# Patient Record
Sex: Female | Born: 1950 | Race: White | Hispanic: No | Marital: Married | Smoking: Former smoker
Health system: Southern US, Community
[De-identification: ages and names within clinical notes are randomized; demographics above are authoritative.]

## PROBLEM LIST (undated history)

## (undated) DIAGNOSIS — F32A Depression, unspecified: Secondary | ICD-10-CM

## (undated) DIAGNOSIS — K219 Gastro-esophageal reflux disease without esophagitis: Secondary | ICD-10-CM

## (undated) DIAGNOSIS — C50919 Malignant neoplasm of unspecified site of unspecified female breast: Secondary | ICD-10-CM

## (undated) DIAGNOSIS — Z923 Personal history of irradiation: Secondary | ICD-10-CM

## (undated) DIAGNOSIS — T8859XA Other complications of anesthesia, initial encounter: Secondary | ICD-10-CM

## (undated) DIAGNOSIS — J45909 Unspecified asthma, uncomplicated: Secondary | ICD-10-CM

## (undated) DIAGNOSIS — F329 Major depressive disorder, single episode, unspecified: Secondary | ICD-10-CM

## (undated) DIAGNOSIS — T4145XA Adverse effect of unspecified anesthetic, initial encounter: Secondary | ICD-10-CM

## (undated) HISTORY — PX: COLONOSCOPY: SHX174

## (undated) HISTORY — DX: Unspecified asthma, uncomplicated: J45.909

## (undated) HISTORY — DX: Other complications of anesthesia, initial encounter: T88.59XA

## (undated) HISTORY — PX: POLYPECTOMY: SHX149

## (undated) HISTORY — DX: Adverse effect of unspecified anesthetic, initial encounter: T41.45XA

---

## 1998-12-19 ENCOUNTER — Encounter: Payer: Self-pay | Admitting: Geriatric Medicine

## 1998-12-19 ENCOUNTER — Encounter: Admission: RE | Admit: 1998-12-19 | Discharge: 1998-12-19 | Payer: Self-pay | Admitting: Geriatric Medicine

## 1999-08-07 ENCOUNTER — Other Ambulatory Visit: Admission: RE | Admit: 1999-08-07 | Discharge: 1999-08-07 | Payer: Self-pay | Admitting: Obstetrics and Gynecology

## 1999-08-07 ENCOUNTER — Encounter (INDEPENDENT_AMBULATORY_CARE_PROVIDER_SITE_OTHER): Payer: Self-pay | Admitting: Specialist

## 1999-12-07 ENCOUNTER — Encounter: Payer: Self-pay | Admitting: Obstetrics and Gynecology

## 1999-12-07 ENCOUNTER — Encounter: Admission: RE | Admit: 1999-12-07 | Discharge: 1999-12-07 | Payer: Self-pay | Admitting: Obstetrics and Gynecology

## 2000-12-26 ENCOUNTER — Encounter: Admission: RE | Admit: 2000-12-26 | Discharge: 2000-12-26 | Payer: Self-pay | Admitting: Obstetrics and Gynecology

## 2000-12-26 ENCOUNTER — Encounter: Payer: Self-pay | Admitting: Obstetrics and Gynecology

## 2001-12-31 ENCOUNTER — Encounter: Payer: Self-pay | Admitting: Obstetrics and Gynecology

## 2001-12-31 ENCOUNTER — Encounter: Admission: RE | Admit: 2001-12-31 | Discharge: 2001-12-31 | Payer: Self-pay | Admitting: Obstetrics and Gynecology

## 2002-01-07 ENCOUNTER — Encounter: Payer: Self-pay | Admitting: Obstetrics and Gynecology

## 2002-01-07 ENCOUNTER — Encounter: Admission: RE | Admit: 2002-01-07 | Discharge: 2002-01-07 | Payer: Self-pay | Admitting: Obstetrics and Gynecology

## 2002-12-23 ENCOUNTER — Encounter: Admission: RE | Admit: 2002-12-23 | Discharge: 2002-12-23 | Payer: Self-pay | Admitting: Obstetrics and Gynecology

## 2002-12-23 ENCOUNTER — Encounter: Payer: Self-pay | Admitting: Obstetrics and Gynecology

## 2004-01-19 ENCOUNTER — Encounter: Admission: RE | Admit: 2004-01-19 | Discharge: 2004-01-19 | Payer: Self-pay | Admitting: Obstetrics and Gynecology

## 2004-05-10 ENCOUNTER — Ambulatory Visit: Payer: Self-pay | Admitting: Internal Medicine

## 2005-01-23 ENCOUNTER — Encounter: Admission: RE | Admit: 2005-01-23 | Discharge: 2005-01-23 | Payer: Self-pay | Admitting: Obstetrics and Gynecology

## 2005-08-07 ENCOUNTER — Ambulatory Visit: Payer: Self-pay | Admitting: Internal Medicine

## 2006-06-19 ENCOUNTER — Encounter: Admission: RE | Admit: 2006-06-19 | Discharge: 2006-06-19 | Payer: Self-pay | Admitting: Obstetrics and Gynecology

## 2006-10-09 ENCOUNTER — Ambulatory Visit: Payer: Self-pay | Admitting: Internal Medicine

## 2007-10-29 ENCOUNTER — Encounter: Admission: RE | Admit: 2007-10-29 | Discharge: 2007-10-29 | Payer: Self-pay | Admitting: Obstetrics and Gynecology

## 2007-11-06 ENCOUNTER — Telehealth: Payer: Self-pay | Admitting: Internal Medicine

## 2007-11-11 DIAGNOSIS — L509 Urticaria, unspecified: Secondary | ICD-10-CM

## 2007-11-11 DIAGNOSIS — J45909 Unspecified asthma, uncomplicated: Secondary | ICD-10-CM

## 2007-11-11 HISTORY — DX: Unspecified asthma, uncomplicated: J45.909

## 2007-11-11 HISTORY — DX: Urticaria, unspecified: L50.9

## 2007-11-12 ENCOUNTER — Ambulatory Visit: Payer: Self-pay | Admitting: Internal Medicine

## 2008-12-01 ENCOUNTER — Encounter: Admission: RE | Admit: 2008-12-01 | Discharge: 2008-12-01 | Payer: Self-pay | Admitting: Internal Medicine

## 2010-01-12 ENCOUNTER — Encounter: Admission: RE | Admit: 2010-01-12 | Discharge: 2010-01-12 | Payer: Self-pay | Admitting: Internal Medicine

## 2010-07-17 NOTE — Assessment & Plan Note (Signed)
[facility]                             PULMONARY OFFICE NOTE   NAME:Melinda Wolfe, Melinda Wolfe                     MRN:          [id number]  DATE:10/09/2006                            DOB:          Jan 18, 1951    PROBLEMS:  1. Asthma.  2. History of urticaria.   HISTORY:  One year follow up. She used Singulair in the spring, but not  needed since. Very rare use of albuterol. No urticaria in a long time.  Overall, she is pleased with her status.   CURRENT MEDICATIONS:  1. Singulair p.r.n.  2. Advair 100/50.  3. Paxil 40 mg.  4. Aspirin 81 mg.  5. Albuterol rescue inhaler.  6. Xanax 0.25 mg occasionally.   ALLERGIES:  No medication allergy.   OBJECTIVE:  Weight 195 pounds, blood pressure 134/78, pulse 116, room  air saturation 97%. She continues to smoke. Chest is clear. Breathing is  unlabored. Heart sounds normal. No adenopathy.   IMPRESSION:  Asthma has been minimal with a seasonal component. The  biggest longterm concern would be her cigarette use. Urticaria seems to  be inactive now.   PLAN:  Refill medications; quit smoking, okay to try Advair once daily.  Schedule return 1 year, earlier p.r.n.     Clinton D. Maple Hudson, MD, Tonny Bollman, FACP  Electronically Signed    CDY/MedQ  DD: 10/12/2006  DT: 10/13/2006  Job #: [id number]

## 2011-02-12 ENCOUNTER — Other Ambulatory Visit: Payer: Self-pay | Admitting: Internal Medicine

## 2011-02-12 DIAGNOSIS — Z1231 Encounter for screening mammogram for malignant neoplasm of breast: Secondary | ICD-10-CM

## 2011-03-14 ENCOUNTER — Ambulatory Visit: Payer: Self-pay

## 2011-03-28 ENCOUNTER — Ambulatory Visit
Admission: RE | Admit: 2011-03-28 | Discharge: 2011-03-28 | Disposition: A | Discharge status: Home / Self Care | Payer: BC Managed Care – PPO | Source: Ambulatory Visit | Attending: Internal Medicine | Admitting: Internal Medicine

## 2011-03-28 DIAGNOSIS — Z1231 Encounter for screening mammogram for malignant neoplasm of breast: Secondary | ICD-10-CM

## 2012-04-30 ENCOUNTER — Other Ambulatory Visit: Payer: Self-pay

## 2012-05-28 ENCOUNTER — Ambulatory Visit
Admission: RE | Admit: 2012-05-28 | Discharge: 2012-05-28 | Disposition: A | Discharge status: Home / Self Care | Payer: BC Managed Care – PPO | Source: Ambulatory Visit

## 2012-05-28 DIAGNOSIS — Z1231 Encounter for screening mammogram for malignant neoplasm of breast: Secondary | ICD-10-CM

## 2013-05-20 ENCOUNTER — Other Ambulatory Visit: Payer: Self-pay

## 2013-05-20 DIAGNOSIS — Z1231 Encounter for screening mammogram for malignant neoplasm of breast: Secondary | ICD-10-CM

## 2013-06-10 ENCOUNTER — Ambulatory Visit
Admission: RE | Admit: 2013-06-10 | Discharge: 2013-06-10 | Disposition: A | Discharge status: Home / Self Care | Payer: Commercial Managed Care - PPO | Source: Ambulatory Visit

## 2013-06-10 ENCOUNTER — Ambulatory Visit: Payer: BC Managed Care – PPO

## 2013-06-10 DIAGNOSIS — Z1231 Encounter for screening mammogram for malignant neoplasm of breast: Secondary | ICD-10-CM

## 2014-07-22 ENCOUNTER — Other Ambulatory Visit: Payer: Self-pay

## 2014-07-22 DIAGNOSIS — Z1231 Encounter for screening mammogram for malignant neoplasm of breast: Secondary | ICD-10-CM

## 2014-08-04 ENCOUNTER — Encounter (INDEPENDENT_AMBULATORY_CARE_PROVIDER_SITE_OTHER): Payer: Self-pay

## 2014-08-04 ENCOUNTER — Ambulatory Visit
Admission: RE | Admit: 2014-08-04 | Discharge: 2014-08-04 | Disposition: A | Discharge status: Home / Self Care | Payer: Commercial Managed Care - PPO | Source: Ambulatory Visit

## 2014-08-04 DIAGNOSIS — Z1231 Encounter for screening mammogram for malignant neoplasm of breast: Secondary | ICD-10-CM

## 2015-07-19 DIAGNOSIS — R5381 Other malaise: Secondary | ICD-10-CM

## 2015-07-19 DIAGNOSIS — F419 Anxiety disorder, unspecified: Secondary | ICD-10-CM | POA: Insufficient documentation

## 2015-07-19 DIAGNOSIS — E782 Mixed hyperlipidemia: Secondary | ICD-10-CM | POA: Insufficient documentation

## 2015-07-19 DIAGNOSIS — R5383 Other fatigue: Secondary | ICD-10-CM | POA: Insufficient documentation

## 2015-07-19 DIAGNOSIS — E559 Vitamin D deficiency, unspecified: Secondary | ICD-10-CM | POA: Insufficient documentation

## 2015-07-19 DIAGNOSIS — F339 Major depressive disorder, recurrent, unspecified: Secondary | ICD-10-CM

## 2015-07-19 DIAGNOSIS — J4520 Mild intermittent asthma, uncomplicated: Secondary | ICD-10-CM

## 2015-07-19 HISTORY — DX: Hypercalcemia: E83.52

## 2015-07-19 HISTORY — DX: Other malaise: R53.81

## 2015-07-19 HISTORY — DX: Vitamin D deficiency, unspecified: E55.9

## 2015-07-19 HISTORY — DX: Major depressive disorder, recurrent, unspecified: F33.9

## 2015-07-19 HISTORY — DX: Mild intermittent asthma, uncomplicated: J45.20

## 2015-07-19 HISTORY — DX: Anxiety disorder, unspecified: F41.9

## 2015-07-19 HISTORY — DX: Mixed hyperlipidemia: E78.2

## 2015-07-20 DIAGNOSIS — K219 Gastro-esophageal reflux disease without esophagitis: Secondary | ICD-10-CM | POA: Insufficient documentation

## 2015-07-20 HISTORY — DX: Gastro-esophageal reflux disease without esophagitis: K21.9

## 2015-11-29 ENCOUNTER — Other Ambulatory Visit: Payer: Self-pay | Admitting: Internal Medicine

## 2015-11-29 DIAGNOSIS — Z1231 Encounter for screening mammogram for malignant neoplasm of breast: Secondary | ICD-10-CM

## 2015-12-07 ENCOUNTER — Ambulatory Visit: Payer: Commercial Managed Care - PPO

## 2016-03-04 DIAGNOSIS — C50919 Malignant neoplasm of unspecified site of unspecified female breast: Secondary | ICD-10-CM | POA: Insufficient documentation

## 2016-03-04 DIAGNOSIS — Z923 Personal history of irradiation: Secondary | ICD-10-CM

## 2016-03-04 HISTORY — DX: Malignant neoplasm of unspecified site of unspecified female breast: C50.919

## 2016-03-04 HISTORY — DX: Personal history of irradiation: Z92.3

## 2016-03-21 ENCOUNTER — Ambulatory Visit: Payer: Commercial Managed Care - PPO

## 2016-03-28 ENCOUNTER — Ambulatory Visit
Admission: RE | Admit: 2016-03-28 | Discharge: 2016-03-28 | Disposition: A | Discharge status: Home / Self Care | Payer: Commercial Managed Care - PPO | Source: Ambulatory Visit | Attending: Internal Medicine | Admitting: Internal Medicine

## 2016-03-28 DIAGNOSIS — Z1231 Encounter for screening mammogram for malignant neoplasm of breast: Secondary | ICD-10-CM

## 2016-03-30 DIAGNOSIS — R9389 Abnormal findings on diagnostic imaging of other specified body structures: Secondary | ICD-10-CM

## 2016-03-30 HISTORY — DX: Abnormal findings on diagnostic imaging of other specified body structures: R93.89

## 2016-04-01 ENCOUNTER — Other Ambulatory Visit: Payer: Self-pay | Admitting: Internal Medicine

## 2016-04-01 DIAGNOSIS — R928 Other abnormal and inconclusive findings on diagnostic imaging of breast: Secondary | ICD-10-CM

## 2016-04-03 ENCOUNTER — Other Ambulatory Visit: Payer: Self-pay | Admitting: Internal Medicine

## 2016-04-03 ENCOUNTER — Ambulatory Visit
Admission: RE | Admit: 2016-04-03 | Discharge: 2016-04-03 | Disposition: A | Discharge status: Home / Self Care | Payer: Commercial Managed Care - PPO | Source: Ambulatory Visit | Attending: Internal Medicine | Admitting: Internal Medicine

## 2016-04-03 DIAGNOSIS — R928 Other abnormal and inconclusive findings on diagnostic imaging of breast: Secondary | ICD-10-CM

## 2016-04-04 ENCOUNTER — Other Ambulatory Visit: Payer: Self-pay | Admitting: Internal Medicine

## 2016-04-04 DIAGNOSIS — R928 Other abnormal and inconclusive findings on diagnostic imaging of breast: Secondary | ICD-10-CM

## 2016-04-05 ENCOUNTER — Inpatient Hospital Stay: Admission: RE | Admit: 2016-04-05 | Payer: Commercial Managed Care - PPO | Source: Ambulatory Visit

## 2016-04-11 ENCOUNTER — Ambulatory Visit
Admission: RE | Admit: 2016-04-11 | Discharge: 2016-04-11 | Disposition: A | Discharge status: Home / Self Care | Payer: Commercial Managed Care - PPO | Source: Ambulatory Visit | Attending: Internal Medicine | Admitting: Internal Medicine

## 2016-04-11 ENCOUNTER — Other Ambulatory Visit: Payer: Commercial Managed Care - PPO

## 2016-04-11 DIAGNOSIS — R928 Other abnormal and inconclusive findings on diagnostic imaging of breast: Secondary | ICD-10-CM

## 2016-04-17 ENCOUNTER — Other Ambulatory Visit: Payer: Self-pay | Admitting: General Surgery

## 2016-04-17 DIAGNOSIS — C50411 Malignant neoplasm of upper-outer quadrant of right female breast: Secondary | ICD-10-CM

## 2016-04-17 DIAGNOSIS — Z170 Estrogen receptor positive status [ER+]: Principal | ICD-10-CM

## 2016-04-17 NOTE — Progress Notes (Signed)
Boost drink given with instructions/ pt verbalized understanding.

## 2016-04-22 HISTORY — PX: BREAST BIOPSY: SHX20

## 2016-04-25 ENCOUNTER — Encounter (HOSPITAL_BASED_OUTPATIENT_CLINIC_OR_DEPARTMENT_OTHER): Payer: Self-pay | Admitting: *Deleted

## 2016-04-25 DIAGNOSIS — C50412 Malignant neoplasm of upper-outer quadrant of left female breast: Secondary | ICD-10-CM

## 2016-04-25 DIAGNOSIS — Z170 Estrogen receptor positive status [ER+]: Secondary | ICD-10-CM

## 2016-04-29 ENCOUNTER — Other Ambulatory Visit: Payer: Self-pay | Admitting: General Surgery

## 2016-04-29 ENCOUNTER — Ambulatory Visit
Admission: RE | Admit: 2016-04-29 | Discharge: 2016-04-29 | Disposition: A | Discharge status: Home / Self Care | Payer: Commercial Managed Care - PPO | Source: Ambulatory Visit | Attending: General Surgery | Admitting: General Surgery

## 2016-04-29 ENCOUNTER — Encounter (HOSPITAL_BASED_OUTPATIENT_CLINIC_OR_DEPARTMENT_OTHER): Payer: Self-pay | Admitting: *Deleted

## 2016-04-29 DIAGNOSIS — C50412 Malignant neoplasm of upper-outer quadrant of left female breast: Secondary | ICD-10-CM

## 2016-04-29 DIAGNOSIS — C50411 Malignant neoplasm of upper-outer quadrant of right female breast: Secondary | ICD-10-CM

## 2016-04-29 DIAGNOSIS — C50912 Malignant neoplasm of unspecified site of left female breast: Secondary | ICD-10-CM

## 2016-04-29 DIAGNOSIS — Z170 Estrogen receptor positive status [ER+]: Principal | ICD-10-CM

## 2016-05-02 ENCOUNTER — Ambulatory Visit (HOSPITAL_BASED_OUTPATIENT_CLINIC_OR_DEPARTMENT_OTHER): Payer: Commercial Managed Care - PPO | Admitting: Anesthesiology

## 2016-05-02 ENCOUNTER — Encounter (HOSPITAL_BASED_OUTPATIENT_CLINIC_OR_DEPARTMENT_OTHER): Payer: Self-pay | Admitting: Anesthesiology

## 2016-05-02 ENCOUNTER — Ambulatory Visit (HOSPITAL_BASED_OUTPATIENT_CLINIC_OR_DEPARTMENT_OTHER)
Admission: RE | Admit: 2016-05-02 | Discharge: 2016-05-02 | Disposition: A | Discharge status: Home / Self Care | Payer: Commercial Managed Care - PPO | Source: Ambulatory Visit | Attending: General Surgery | Admitting: General Surgery

## 2016-05-02 ENCOUNTER — Ambulatory Visit
Admission: RE | Admit: 2016-05-02 | Discharge: 2016-05-02 | Disposition: A | Discharge status: Home / Self Care | Payer: Commercial Managed Care - PPO | Source: Ambulatory Visit | Attending: General Surgery | Admitting: General Surgery

## 2016-05-02 ENCOUNTER — Encounter (HOSPITAL_COMMUNITY)
Admission: RE | Admit: 2016-05-02 | Discharge: 2016-05-02 | Disposition: A | Discharge status: Home / Self Care | Payer: Commercial Managed Care - PPO | Source: Ambulatory Visit | Attending: General Surgery | Admitting: General Surgery

## 2016-05-02 ENCOUNTER — Encounter (HOSPITAL_COMMUNITY): Payer: Commercial Managed Care - PPO

## 2016-05-02 ENCOUNTER — Encounter (HOSPITAL_BASED_OUTPATIENT_CLINIC_OR_DEPARTMENT_OTHER)
Admission: RE | Disposition: A | Discharge status: Home / Self Care | Payer: Self-pay | Source: Ambulatory Visit | Attending: General Surgery

## 2016-05-02 DIAGNOSIS — E669 Obesity, unspecified: Secondary | ICD-10-CM | POA: Insufficient documentation

## 2016-05-02 DIAGNOSIS — F329 Major depressive disorder, single episode, unspecified: Secondary | ICD-10-CM | POA: Insufficient documentation

## 2016-05-02 DIAGNOSIS — Z6833 Body mass index (BMI) 33.0-33.9, adult: Secondary | ICD-10-CM | POA: Insufficient documentation

## 2016-05-02 DIAGNOSIS — Z888 Allergy status to other drugs, medicaments and biological substances status: Secondary | ICD-10-CM | POA: Diagnosis not present

## 2016-05-02 DIAGNOSIS — Z170 Estrogen receptor positive status [ER+]: Secondary | ICD-10-CM | POA: Insufficient documentation

## 2016-05-02 DIAGNOSIS — Z7982 Long term (current) use of aspirin: Secondary | ICD-10-CM | POA: Insufficient documentation

## 2016-05-02 DIAGNOSIS — N6012 Diffuse cystic mastopathy of left breast: Secondary | ICD-10-CM | POA: Insufficient documentation

## 2016-05-02 DIAGNOSIS — C50912 Malignant neoplasm of unspecified site of left female breast: Secondary | ICD-10-CM

## 2016-05-02 DIAGNOSIS — C50411 Malignant neoplasm of upper-outer quadrant of right female breast: Secondary | ICD-10-CM | POA: Diagnosis present

## 2016-05-02 DIAGNOSIS — K219 Gastro-esophageal reflux disease without esophagitis: Secondary | ICD-10-CM | POA: Insufficient documentation

## 2016-05-02 HISTORY — DX: Depression, unspecified: F32.A

## 2016-05-02 HISTORY — DX: Major depressive disorder, single episode, unspecified: F32.9

## 2016-05-02 HISTORY — DX: Gastro-esophageal reflux disease without esophagitis: K21.9

## 2016-05-02 HISTORY — PX: BREAST LUMPECTOMY: SHX2

## 2016-05-02 HISTORY — PX: BREAST LUMPECTOMY WITH RADIOACTIVE SEED AND SENTINEL LYMPH NODE BIOPSY: SHX6550

## 2016-05-02 LAB — POCT HEMOGLOBIN-HEMACUE: Hemoglobin: 12.2 g/dL (ref 12.0–15.0)

## 2016-05-02 SURGERY — BREAST LUMPECTOMY WITH RADIOACTIVE SEED AND SENTINEL LYMPH NODE BIOPSY
Anesthesia: General | Site: Breast | Laterality: Left

## 2016-05-02 MED ORDER — GABAPENTIN 300 MG PO CAPS
ORAL_CAPSULE | ORAL | Status: AC
  Filled 2016-05-02: qty 1

## 2016-05-02 MED ORDER — CEFAZOLIN SODIUM-DEXTROSE 2-3 GM-% IV SOLR
INTRAVENOUS | Status: DC | PRN
  Administered 2016-05-02: 2 g via INTRAVENOUS

## 2016-05-02 MED ORDER — PROPOFOL 10 MG/ML IV BOLUS
INTRAVENOUS | Status: DC | PRN
  Administered 2016-05-02: 200 mg via INTRAVENOUS

## 2016-05-02 MED ORDER — MIDAZOLAM HCL 2 MG/2ML IJ SOLN
INTRAMUSCULAR | Status: AC
Start: 2016-05-02 — End: 2016-05-02
  Filled 2016-05-02: qty 2

## 2016-05-02 MED ORDER — CHLORHEXIDINE GLUCONATE CLOTH 2 % EX PADS: 6.0000 | MEDICATED_PAD | Freq: Once | CUTANEOUS | Status: DC

## 2016-05-02 MED ORDER — ONDANSETRON HCL 4 MG/2ML IJ SOLN: 4.0000 mg | Freq: Four times a day (QID) | INTRAMUSCULAR | Status: DC | PRN

## 2016-05-02 MED ORDER — FENTANYL CITRATE (PF) 100 MCG/2ML IJ SOLN
INTRAMUSCULAR | Status: AC
  Filled 2016-05-02: qty 2

## 2016-05-02 MED ORDER — OXYCODONE-ACETAMINOPHEN 10-325 MG PO TABS: 1.0000 | ORAL_TABLET | Freq: Four times a day (QID) | ORAL | 0 refills | Status: AC | PRN

## 2016-05-02 MED ORDER — FENTANYL CITRATE (PF) 100 MCG/2ML IJ SOLN
INTRAMUSCULAR | Status: DC | PRN
  Administered 2016-05-02 (×2): 25 ug via INTRAVENOUS

## 2016-05-02 MED ORDER — LIDOCAINE HCL (CARDIAC) 20 MG/ML IV SOLN
INTRAVENOUS | Status: DC | PRN
  Administered 2016-05-02: 10 mg via INTRAVENOUS

## 2016-05-02 MED ORDER — OXYCODONE HCL 5 MG/5ML PO SOLN: 5.0000 mg | Freq: Once | ORAL | Status: AC | PRN

## 2016-05-02 MED ORDER — LACTATED RINGERS IV SOLN
INTRAVENOUS | Status: DC
  Administered 2016-05-02: 09:00:00 via INTRAVENOUS

## 2016-05-02 MED ORDER — TECHNETIUM TC 99M SULFUR COLLOID FILTERED
1.0000 | Freq: Once | INTRAVENOUS | Status: AC | PRN
  Administered 2016-05-02: 1 via INTRADERMAL

## 2016-05-02 MED ORDER — OXYCODONE HCL 5 MG PO TABS
5.0000 mg | ORAL_TABLET | Freq: Once | ORAL | Status: AC | PRN
  Administered 2016-05-02: 5 mg via ORAL

## 2016-05-02 MED ORDER — CEFAZOLIN SODIUM-DEXTROSE 2-4 GM/100ML-% IV SOLN
INTRAVENOUS | Status: AC
  Filled 2016-05-02: qty 100

## 2016-05-02 MED ORDER — PROPOFOL 500 MG/50ML IV EMUL
INTRAVENOUS | Status: AC
  Filled 2016-05-02: qty 50

## 2016-05-02 MED ORDER — ACETAMINOPHEN 500 MG PO TABS
1000.0000 mg | ORAL_TABLET | ORAL | Status: AC
  Administered 2016-05-02: 1000 mg via ORAL

## 2016-05-02 MED ORDER — ONDANSETRON HCL 4 MG/2ML IJ SOLN
INTRAMUSCULAR | Status: AC
  Filled 2016-05-02: qty 2

## 2016-05-02 MED ORDER — LIDOCAINE 2% (20 MG/ML) 5 ML SYRINGE
INTRAMUSCULAR | Status: AC
  Filled 2016-05-02: qty 5

## 2016-05-02 MED ORDER — LACTATED RINGERS IV SOLN
INTRAVENOUS | Status: DC | PRN
  Administered 2016-05-02 (×2): via INTRAVENOUS

## 2016-05-02 MED ORDER — MIDAZOLAM HCL 2 MG/2ML IJ SOLN
INTRAMUSCULAR | Status: AC
  Filled 2016-05-02: qty 2

## 2016-05-02 MED ORDER — CEFAZOLIN SODIUM-DEXTROSE 2-4 GM/100ML-% IV SOLN: 2.0000 g | INTRAVENOUS | Status: DC

## 2016-05-02 MED ORDER — FENTANYL CITRATE (PF) 100 MCG/2ML IJ SOLN: 25.0000 ug | INTRAMUSCULAR | Status: DC | PRN

## 2016-05-02 MED ORDER — DEXAMETHASONE SODIUM PHOSPHATE 4 MG/ML IJ SOLN
INTRAMUSCULAR | Status: DC | PRN
  Administered 2016-05-02: 10 mg via INTRAVENOUS

## 2016-05-02 MED ORDER — ONDANSETRON HCL 4 MG/2ML IJ SOLN
INTRAMUSCULAR | Status: DC | PRN
  Administered 2016-05-02: 4 mg via INTRAVENOUS

## 2016-05-02 MED ORDER — BUPIVACAINE-EPINEPHRINE (PF) 0.5% -1:200000 IJ SOLN
INTRAMUSCULAR | Status: DC | PRN
  Administered 2016-05-02: 25 mL via PERINEURAL

## 2016-05-02 MED ORDER — OXYCODONE HCL 5 MG PO TABS
ORAL_TABLET | ORAL | Status: AC
  Filled 2016-05-02: qty 1

## 2016-05-02 MED ORDER — BUPIVACAINE HCL (PF) 0.25 % IJ SOLN
INTRAMUSCULAR | Status: DC | PRN
  Administered 2016-05-02: 3 mL

## 2016-05-02 MED ORDER — FENTANYL CITRATE (PF) 100 MCG/2ML IJ SOLN
50.0000 ug | INTRAMUSCULAR | Status: DC | PRN
  Administered 2016-05-02 (×2): 50 ug via INTRAVENOUS

## 2016-05-02 MED ORDER — ACETAMINOPHEN 500 MG PO TABS
ORAL_TABLET | ORAL | Status: AC
  Filled 2016-05-02: qty 2

## 2016-05-02 MED ORDER — GABAPENTIN 300 MG PO CAPS
300.0000 mg | ORAL_CAPSULE | ORAL | Status: AC
  Administered 2016-05-02: 300 mg via ORAL

## 2016-05-02 MED ORDER — GLYCOPYRROLATE 0.2 MG/ML IJ SOLN
INTRAMUSCULAR | Status: DC | PRN
  Administered 2016-05-02: 0.2 mg via INTRAVENOUS

## 2016-05-02 MED ORDER — DEXAMETHASONE SODIUM PHOSPHATE 10 MG/ML IJ SOLN
INTRAMUSCULAR | Status: AC
  Filled 2016-05-02: qty 1

## 2016-05-02 MED ORDER — SCOPOLAMINE 1 MG/3DAYS TD PT72: 1.0000 | MEDICATED_PATCH | Freq: Once | TRANSDERMAL | Status: DC | PRN

## 2016-05-02 MED ORDER — MIDAZOLAM HCL 2 MG/2ML IJ SOLN
1.0000 mg | INTRAMUSCULAR | Status: DC | PRN
  Administered 2016-05-02 (×2): 1 mg via INTRAVENOUS

## 2016-05-02 SURGICAL SUPPLY — 59 items
BINDER BREAST LRG (GAUZE/BANDAGES/DRESSINGS) IMPLANT
BINDER BREAST MEDIUM (GAUZE/BANDAGES/DRESSINGS) IMPLANT
BINDER BREAST XLRG (GAUZE/BANDAGES/DRESSINGS) IMPLANT
BINDER BREAST XXLRG (GAUZE/BANDAGES/DRESSINGS) IMPLANT
BLADE SURG 15 STRL LF DISP TIS (BLADE) ×1 IMPLANT
BLADE SURG 15 STRL SS (BLADE) ×2
CANISTER SUC SOCK COL 7IN (MISCELLANEOUS) IMPLANT
CANISTER SUCT 1200ML W/VALVE (MISCELLANEOUS) IMPLANT
CHLORAPREP W/TINT 26ML (MISCELLANEOUS) ×3 IMPLANT
CLIP TI WIDE RED SMALL 6 (CLIP) ×3 IMPLANT
CLOSURE WOUND 1/2 X4 (GAUZE/BANDAGES/DRESSINGS) ×1
COVER BACK TABLE 60X90IN (DRAPES) ×3 IMPLANT
COVER MAYO STAND STRL (DRAPES) ×3 IMPLANT
COVER PROBE W GEL 5X96 (DRAPES) ×3 IMPLANT
DECANTER SPIKE VIAL GLASS SM (MISCELLANEOUS) IMPLANT
DERMABOND ADVANCED (GAUZE/BANDAGES/DRESSINGS) ×2
DERMABOND ADVANCED .7 DNX12 (GAUZE/BANDAGES/DRESSINGS) ×1 IMPLANT
DEVICE DUBIN W/COMP PLATE 8390 (MISCELLANEOUS) ×3 IMPLANT
DRAPE LAPAROSCOPIC ABDOMINAL (DRAPES) ×3 IMPLANT
DRAPE UTILITY XL STRL (DRAPES) ×3 IMPLANT
ELECT COATED BLADE 2.86 ST (ELECTRODE) ×3 IMPLANT
ELECT REM PT RETURN 9FT ADLT (ELECTROSURGICAL) ×3
ELECTRODE REM PT RTRN 9FT ADLT (ELECTROSURGICAL) ×1 IMPLANT
GLOVE BIO SURGEON STRL SZ7 (GLOVE) ×6 IMPLANT
GLOVE BIOGEL PI IND STRL 7.0 (GLOVE) ×2 IMPLANT
GLOVE BIOGEL PI IND STRL 7.5 (GLOVE) ×1 IMPLANT
GLOVE BIOGEL PI INDICATOR 7.0 (GLOVE) ×4
GLOVE BIOGEL PI INDICATOR 7.5 (GLOVE) ×2
GLOVE ECLIPSE 6.5 STRL STRAW (GLOVE) ×3 IMPLANT
GOWN STRL REUS W/ TWL LRG LVL3 (GOWN DISPOSABLE) ×2 IMPLANT
GOWN STRL REUS W/TWL LRG LVL3 (GOWN DISPOSABLE) ×4
HEMOSTAT ARISTA ABSORB 3G PWDR (MISCELLANEOUS) IMPLANT
ILLUMINATOR WAVEGUIDE N/F (MISCELLANEOUS) IMPLANT
KIT MARKER MARGIN INK (KITS) ×3 IMPLANT
LIGHT WAVEGUIDE WIDE FLAT (MISCELLANEOUS) IMPLANT
NDL SAFETY ECLIPSE 18X1.5 (NEEDLE) IMPLANT
NEEDLE HYPO 18GX1.5 SHARP (NEEDLE)
NEEDLE HYPO 25X1 1.5 SAFETY (NEEDLE) ×3 IMPLANT
NS IRRIG 1000ML POUR BTL (IV SOLUTION) IMPLANT
PACK BASIN DAY SURGERY FS (CUSTOM PROCEDURE TRAY) ×3 IMPLANT
PENCIL BUTTON HOLSTER BLD 10FT (ELECTRODE) ×3 IMPLANT
SLEEVE SCD COMPRESS KNEE MED (MISCELLANEOUS) ×3 IMPLANT
SPONGE LAP 4X18 X RAY DECT (DISPOSABLE) ×3 IMPLANT
STRIP CLOSURE SKIN 1/2X4 (GAUZE/BANDAGES/DRESSINGS) ×2 IMPLANT
SUT ETHILON 2 0 FS 18 (SUTURE) IMPLANT
SUT MNCRL AB 4-0 PS2 18 (SUTURE) ×3 IMPLANT
SUT MON AB 5-0 PS2 18 (SUTURE) IMPLANT
SUT SILK 2 0 SH (SUTURE) ×3 IMPLANT
SUT VIC AB 2-0 SH 27 (SUTURE) ×4
SUT VIC AB 2-0 SH 27XBRD (SUTURE) ×2 IMPLANT
SUT VIC AB 3-0 SH 27 (SUTURE) ×2
SUT VIC AB 3-0 SH 27X BRD (SUTURE) ×1 IMPLANT
SUT VIC AB 5-0 PS2 18 (SUTURE) IMPLANT
SYR CONTROL 10ML LL (SYRINGE) ×3 IMPLANT
TOWEL OR 17X24 6PK STRL BLUE (TOWEL DISPOSABLE) ×3 IMPLANT
TOWEL OR NON WOVEN STRL DISP B (DISPOSABLE) ×3 IMPLANT
TUBE CONNECTING 20'X1/4 (TUBING)
TUBE CONNECTING 20X1/4 (TUBING) IMPLANT
YANKAUER SUCT BULB TIP NO VENT (SUCTIONS) IMPLANT

## 2016-05-02 NOTE — Anesthesia Preprocedure Evaluation (Signed)
Anesthesia Evaluation  Patient identified by MRN, date of birth, ID band Patient awake    Reviewed: Allergy & Precautions, H&P , NPO status , Patient's Chart, lab work & pertinent test results  Airway Mallampati: II   Neck ROM: full    Dental   Pulmonary neg pulmonary ROS,    breath sounds clear to auscultation       Cardiovascular negative cardio ROS   Rhythm:regular Rate:Normal     Neuro/Psych PSYCHIATRIC DISORDERS Depression    GI/Hepatic GERD  ,  Endo/Other  obese  Renal/GU      Musculoskeletal   Abdominal   Peds  Hematology   Anesthesia Other Findings   Reproductive/Obstetrics                             Anesthesia Physical Anesthesia Plan  ASA: II  Anesthesia Plan: General   Post-op Pain Management:  Regional for Post-op pain   Induction: Intravenous  Airway Management Planned: LMA  Additional Equipment:   Intra-op Plan:   Post-operative Plan:   Informed Consent: I have reviewed the patients History and Physical, chart, labs and discussed the procedure including the risks, benefits and alternatives for the proposed anesthesia with the patient or authorized representative who has indicated his/her understanding and acceptance.     Plan Discussed with: CRNA, Anesthesiologist and Surgeon  Anesthesia Plan Comments:         Anesthesia Quick Evaluation

## 2016-05-02 NOTE — Progress Notes (Signed)
Assisted Dr. Marcie Bal with left, ultrasound guided, pectoralis block. Side rails up, monitors on throughout procedure. See vital signs in flow sheet. Tolerated Procedure well.

## 2016-05-02 NOTE — Interval H&P Note (Signed)
History and Physical Interval Note:  05/02/2016 9:59 AM  Melinda Wolfe  has presented today for surgery, with the diagnosis of LEFT BREAST CANCER  The various methods of treatment have been discussed with the patient and family. After consideration of risks, benefits and other options for treatment, the patient has consented to  Procedure(s): BREAST LUMPECTOMY WITH RADIOACTIVE SEED AND SENTINEL LYMPH NODE BIOPSY (Left) as a surgical intervention .  The patient's history has been reviewed, patient examined, no change in status, stable for surgery.  I have reviewed the patient's chart and labs.  Questions were answered to the patient's satisfaction.     Temia Debroux

## 2016-05-02 NOTE — Progress Notes (Signed)
Assisted nuc med tech # [id number] with nuc med inj. Side rails up, monitors on throughout procedure. See vital signs in flow sheet. Tolerated Procedure well. 

## 2016-05-02 NOTE — Anesthesia Procedure Notes (Signed)
Anesthesia Regional Block: Pectoralis block   Pre-Anesthetic Checklist: ,, timeout performed, Correct Patient, Correct Site, Correct Laterality, Correct Procedure, Correct Position, site marked, Risks and benefits discussed,  Surgical consent,  Pre-op evaluation,  At surgeon's request and post-op pain management  Laterality: Left  Prep: chloraprep       Needles:  Injection technique: Single-shot  Needle Type: Echogenic Needle     Needle Length: 9cm  Needle Gauge: 21     Additional Needles:   Procedures: ultrasound guided,,,,,,,,  Narrative:  Start time: 05/02/2016 8:54 AM End time: 05/02/2016 9:01 AM Injection made incrementally with aspirations every 5 mL.  Performed by: Personally  Anesthesiologist: Jackelynn Hosie  Additional Notes: Pt tolerated the procedure well.

## 2016-05-02 NOTE — Anesthesia Procedure Notes (Signed)
Procedure Name: LMA Insertion Date/Time: 05/02/2016 10:11 AM Performed by: Toula Moos L Pre-anesthesia Checklist: Patient identified, Emergency Drugs available, Suction available, Patient being monitored and Timeout performed Patient Re-evaluated:Patient Re-evaluated prior to inductionOxygen Delivery Method: Circle system utilized Preoxygenation: Pre-oxygenation with 100% oxygen Intubation Type: IV induction Ventilation: Mask ventilation without difficulty LMA: LMA inserted LMA Size: 4.0 Number of attempts: 1 Airway Equipment and Method: Bite block Placement Confirmation: positive ETCO2 Tube secured with: Tape Dental Injury: Teeth and Oropharynx as per pre-operative assessment

## 2016-05-02 NOTE — H&P (Signed)
3 yof referred by Dr Gilford Rile for newly diagnosed left breast cancer. she has no personal or family history of breast cancer. she underwent screening mm with c density breasts. was noted to have a mass in lateral left breast. US shows a 6x4x6 mm mass at 2 oclock 6 cm from nipple. [place] axilla negative. 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 she has no mass or dc. she works as Facilities manager asst in [employer] she is here by herself to discuss options. she lives at home with her husband and has one son  Past Surgical History Rolm Bookbinder, MD; 04/17/2016 1:28 PM) No pertinent past surgical history   Diagnostic Studies History Rolm Bookbinder, MD; 04/17/2016 1:28 PM) Colonoscopy  1-5 years ago Mammogram  within last year Pap Smear  1-5 years ago  Allergies (April Staton, [place]; 04/17/2016 10:35 AM) Statins   Medication History (April Staton, CMA; 04/17/2016 10:38 AM) Alendronate Sodium (70MG  Tablet, Oral) Active. Paxil (Oral) Specific strength unknown - Active. Omeprazole (20MG  Capsule DR, Oral) Active. Singulair (10MG  Tablet, Oral) Active. Symbicort (80-4.5MCG/ACT Aerosol, Inhalation) Active. Vitamin B-12 (100MCG Tablet, Oral) Active. Vitamin D (400UNIT Tablet, Oral) Active. Aspirin (81MG  Tablet, Oral) Active. Calcium (150MG  Tablet, Oral) Active. Medications Reconciled  Social History Rolm Bookbinder, MD; 04/17/2016 1:28 PM) Alcohol use  Occasional alcohol use. Caffeine use  Coffee, Tea. No drug use  Tobacco use  Former smoker.  Family History Rolm Bookbinder, MD; 04/17/2016 1:28 PM) Colon Cancer  Mother. Heart disease in female family member before age 24  Migraine Headache  Mother.  Vitals (April Staton CMA; 04/17/2016 10:39 AM) 04/17/2016 10:38 AM Weight: 199.13 lb Height: 65in Body Surface Area: 1.97 m Body Mass Index: 33.14 kg/m  Temp.: 98.6F(Oral)  Pulse: 91 (Regular)  BP: 130/78 (Sitting, Left  Arm, Standard) Physical Exam Rolm Bookbinder MD; 04/17/2016 11:29 AM) General Mental Status-Alert. Orientation-Oriented X3. Eye Sclera/Conjunctiva - Bilateral-No scleral icterus. Chest and Lung Exam Chest and lung exam reveals -on auscultation, normal breath sounds, no adventitious sounds and normal vocal resonance. Breast Nipples-No Discharge. Breast Lump-No Palpable Breast Mass. Cardiovascular Cardiovascular examination reveals -normal heart sounds, regular rate and rhythm with no murmurs. Lymphatic Head & Neck General Head & Neck Lymphatics: Bilateral - Description - Normal. Axillary General Axillary Region: Bilateral - Description - Normal. Note: no [place] adenopathy  Assessment & Plan Rolm Bookbinder MD; 04/17/2016 11:17 AM) BREAST CANCER OF UPPER-OUTER QUADRANT OF RIGHT FEMALE BREAST (C50.411) Story: right breast seed guided lumpectomy, right axillary sn biopsy We discussed the staging and pathophysiology of breast cancer. We discussed all of the different options for treatment for breast cancer including surgery, chemotherapy, radiation therapy, Herceptin, and antiestrogen therapy. We discussed a sentinel lymph node biopsy as she does not appear to having lymph node involvement right now. We discussed the performance of that with injection of radioactive tracer. We discussed that there is a chance of having a positive node with a sentinel lymph node biopsy and we will await the permanent pathology to make any other first further decisions in terms of her treatment. One of these options might be to return to the operating room to perform an axillary lymph node dissection. We discussed up to a 5% risk lifetime of chronic shoulder pain as well as lymphedema associated with a sentinel lymph node biopsy. We discussed the options for treatment of the breast cancer which included lumpectomy versus a mastectomy. We discussed the performance of the lumpectomy with radioactive  seed placement. We discussed a 5-10% chance of a positive margin requiring reexcision in the operating room. We also discussed that she will need radiation therapy (this is usually 5-7 weeks) if she undergoes lumpectomy. We discussed the mastectomy (removal of whole breast) and the postoperative care for that as well. Mastectomy can be followed by reconstruction. The decision for lumpectomy vs mastectomy has no impact on decision for chemotherapy. We discussed that there is no difference in her survival whether she undergoes lumpectomy with radiation therapy or antiestrogen therapy versus a mastectomy. There is also no real difference between her recurrence in the breast. We discussed the risks of operation including bleeding, infection, possible reoperation. She understands her further therapy will be based on what her stages at the time of her operation.

## 2016-05-02 NOTE — Anesthesia Procedure Notes (Signed)
Performed by: Nikcole Eischeid L       

## 2016-05-02 NOTE — Discharge Instructions (Signed)
[facility] °Office Phone Number [phone] ° °POST OP INSTRUCTIONS ° °Always review your discharge instruction sheet given to you by the facility where your surgery was performed. ° °IF YOU HAVE DISABILITY OR FAMILY LEAVE FORMS, YOU MUST BRING THEM TO THE OFFICE FOR PROCESSING.  DO NOT GIVE THEM TO YOUR DOCTOR. ° °1. A prescription for pain medication may be given to you upon discharge.  Take your pain medication as prescribed, if needed.  If narcotic pain medicine is not needed, then you may take acetaminophen (Tylenol), naprosyn (Alleve) or ibuprofen (Advil) as needed. °2. Take your usually prescribed medications unless otherwise directed °3. If you need a refill on your pain medication, please contact your pharmacy.  They will contact our office to request authorization.  Prescriptions will not be filled after 5pm or on week-ends. °4. You should eat very light the first 24 hours after surgery, such as soup, crackers, pudding, etc.  Resume your normal diet the day after surgery. °5. Most patients will experience some swelling and bruising in the breast.  Ice packs and a good support bra will help.  Wear the breast binder provided or a sports bra for 72 hours day and night.  After that wear a sports bra during the day until you return to the office. Swelling and bruising can take several days to resolve.  °6. It is common to experience some constipation if taking pain medication after surgery.  Increasing fluid intake and taking a stool softener will usually help or prevent this problem from occurring.  A mild laxative (Milk of Magnesia or Miralax) should be taken according to package directions if there are no bowel movements after 48 hours. °7. Unless discharge instructions indicate otherwise, you may remove your bandages 48 hours after surgery and you may shower at that time.  You may have steri-strips (small skin tapes) in place directly over the incision.  These strips should be left on the  skin for 7-10 days and will come off on their own.  If your surgeon used skin glue on the incision, you may shower in 24 hours.  The glue will flake off over the next 2-3 weeks.  Any sutures or staples will be removed at the office during your follow-up visit. °8. ACTIVITIES:  You may resume regular daily activities (gradually increasing) beginning the next day.  Wearing a good support bra or sports bra minimizes pain and swelling.  You may have sexual intercourse when it is comfortable. °a. You may drive when you no longer are taking prescription pain medication, you can comfortably wear a seatbelt, and you can safely maneuver your car and apply brakes. °b. RETURN TO WORK:  ______________________________________________________________________________________ °9. You should see your doctor in the office for a follow-up appointment approximately two weeks after your surgery.  Your doctor’s nurse will typically make your follow-up appointment when she calls you with your pathology report.  Expect your pathology report 3-4 business days after your surgery.  You may call to check if you do not hear from us after three days. °10. OTHER INSTRUCTIONS: _______________________________________________________________________________________________ _____________________________________________________________________________________________________________________________________ °_____________________________________________________________________________________________________________________________________ °_____________________________________________________________________________________________________________________________________ ° °WHEN TO CALL DR WAKEFIELD: °1. Fever over 101.0 °2. Nausea and/or vomiting. °3. Extreme swelling or bruising. °4. Continued bleeding from incision. °5. Increased pain, redness, or drainage from the incision. ° °The clinic staff is available to answer your questions during regular  business hours.  Please don’t hesitate to call and ask to speak to one of the nurses for clinical concerns.  If   you have a medical emergency, go to the nearest emergency room or call 911.  A surgeon from [facility] is always on call at the hospital. ° °For further questions, please visit [email or web address] ° ° ° °Post Anesthesia Home Care Instructions ° °Activity: °Get plenty of rest for the remainder of the day. A responsible adult should stay with you for 24 hours following the procedure.  °For the next 24 hours, DO NOT: °-Drive a car °-Operate machinery °-Drink alcoholic beverages °-Take any medication unless instructed by your physician °-Make any legal decisions or sign important papers. ° °Meals: °Start with liquid foods such as gelatin or soup. Progress to regular foods as tolerated. Avoid greasy, spicy, heavy foods. If nausea and/or vomiting occur, drink only clear liquids until the nausea and/or vomiting subsides. Call your physician if vomiting continues. ° °Special Instructions/Symptoms: °Your throat may feel dry or sore from the anesthesia or the breathing tube placed in your throat during surgery. If this causes discomfort, gargle with warm salt water. The discomfort should disappear within 24 hours. ° °If you had a scopolamine patch placed behind your ear for the management of post- operative nausea and/or vomiting: ° °1. The medication in the patch is effective for 72 hours, after which it should be removed.  Wrap patch in a tissue and discard in the trash. Wash hands thoroughly with soap and water. °2. You may remove the patch earlier than 72 hours if you experience unpleasant side effects which may include dry mouth, dizziness or visual disturbances. °3. Avoid touching the patch. Wash your hands with soap and water after contact with the patch. °  ° °

## 2016-05-02 NOTE — Transfer of Care (Signed)
Immediate Anesthesia Transfer of Care Note  Patient: Melinda Wolfe  Procedure(s) Performed: Procedure(s): BREAST LUMPECTOMY WITH RADIOACTIVE SEED AND SENTINEL LYMPH NODE BIOPSY (Left)  Patient Location: PACU  Anesthesia Type:GA combined with regional for post-op pain  Level of Consciousness: sedated and responds to stimulation  Airway & Oxygen Therapy: Patient Spontanous Breathing and Patient connected to face mask oxygen  Post-op Assessment: Report given to RN and Post -op Vital signs reviewed and stable  Post vital signs: Reviewed and stable  Last Vitals:  Vitals:   05/02/16 0914 05/02/16 0915  BP:  (!) 106/59  Pulse: 85 85  Resp: 10 11  Temp:      Last Pain:  Vitals:   05/02/16 0835  TempSrc: Oral         Complications: No apparent anesthesia complications

## 2016-05-02 NOTE — Progress Notes (Signed)
I reviewed [facility] database for narcotics for patient on this day.  No rx were present

## 2016-05-02 NOTE — Op Note (Addendum)
Preoperative diagnosis: Left breast cancer, clinical stage I Postoperative diagnosis: same as above Procedure: 1. Leftbreast seed guided lumpectomy 2. Leftdeep axillary sentinel node biopsy Surgeon Dr Serita Grammes Anes general  EBL: minimal Comps none Specimen:  1. Left breast marked with paint containing seed and clip 2. Leftaxillary sentinel nodes with highest count 273 3. Additional posterior margin marked short superior, long lateral, double deep Sponge count correct at completion Dispoto recovery stable  Indications: This is a 22 yof with stage I left breast cancer.. We have decided to proceed with lumpectomy/sn biopsy.She had seed placed prior to beginning. I had these mm in the OR.   Procedure: After informed consent was obtained the patient was taken to the OR. She was injected with technetium in the standard periareolar fashion.She was given anitibiotics. SCDs were in place. She was prepped and draped in the standard sterile surgical fashion. A timeout was performed. She had undergone a pectoral block.  I then located the seed in the leftlateralbreast.I made a curvilinear incision in left lateral breast as the seed was close to the skin. I did not want to tunnel to that as I made the anterior margin skin. I then removed the seedwith an attempt to get a clear margin. This waspassed off the table after marking with paint.I did confirm removal of theclipand seed with mammography.I had pathology look at specimen grossly and they thought posterior margin was close. I removed additional posterior margin.  I obtained hemostasis. I placed clips in this cavity. I then closed with 2-0 vicryl, 3-0 vicryl and 4-0 monocryl. Glue and steristrips were applied. I then made an incision below the axillary hairline. I then opened the axillary fascia.  I identified the sentinel nodes with the highest count as above. There was no gross nodal disease.There was no background  radioactivity. I obtained hemostasis. I closed the axillary fascia and breast tissue with 2-0 vicryl. I then closed the dermis with 3-0 vicryl and the skin with 4-0 monocryl.  Glue and steristrips were placed A breast binder was placed. She was extubated and transferred to recovery stable

## 2016-05-03 ENCOUNTER — Encounter (HOSPITAL_BASED_OUTPATIENT_CLINIC_OR_DEPARTMENT_OTHER): Payer: Self-pay | Admitting: General Surgery

## 2016-05-03 NOTE — Anesthesia Postprocedure Evaluation (Signed)
Anesthesia Post Note  Patient: Melinda Wolfe  Procedure(s) Performed: Procedure(s) (LRB): BREAST LUMPECTOMY WITH RADIOACTIVE SEED AND SENTINEL LYMPH NODE BIOPSY (Left)  Patient location during evaluation: PACU Anesthesia Type: General Level of consciousness: awake and alert and patient cooperative Pain management: pain level controlled Vital Signs Assessment: post-procedure vital signs reviewed and stable Respiratory status: spontaneous breathing and respiratory function stable Cardiovascular status: stable Anesthetic complications: no       Last Vitals:  Vitals:   05/02/16 1215 05/02/16 1300  BP: 110/63 140/89  Pulse: 86 67  Resp: 15 18  Temp:  36.4 C    Last Pain:  Vitals:   05/03/16 0924  TempSrc:   PainSc: [place]

## 2016-05-16 ENCOUNTER — Telehealth: Payer: Self-pay | Admitting: *Deleted

## 2016-05-16 ENCOUNTER — Encounter: Payer: Self-pay | Admitting: *Deleted

## 2016-05-16 DIAGNOSIS — C50412 Malignant neoplasm of upper-outer quadrant of left female breast: Secondary | ICD-10-CM

## 2016-05-16 DIAGNOSIS — C50919 Malignant neoplasm of unspecified site of unspecified female breast: Secondary | ICD-10-CM | POA: Diagnosis not present

## 2016-05-16 HISTORY — DX: Malignant neoplasm of upper-outer quadrant of left female breast: C50.412

## 2016-05-16 NOTE — Telephone Encounter (Signed)
Received order for oncotype testing. Requisition sent to pathology. Received by Keisha 

## 2016-05-23 ENCOUNTER — Encounter (HOSPITAL_COMMUNITY): Payer: Self-pay

## 2016-05-23 ENCOUNTER — Telehealth: Payer: Self-pay | Admitting: *Deleted

## 2016-05-23 NOTE — Telephone Encounter (Signed)
Received notification from [facility] that d/t insufficient tissue, oncotype could not be done. Informed physician team

## 2016-05-30 ENCOUNTER — Ambulatory Visit: Payer: Commercial Managed Care - PPO | Attending: General Surgery | Admitting: Physical Therapy

## 2016-05-30 DIAGNOSIS — Z483 Aftercare following surgery for neoplasm: Secondary | ICD-10-CM

## 2016-05-30 DIAGNOSIS — C50919 Malignant neoplasm of unspecified site of unspecified female breast: Secondary | ICD-10-CM | POA: Diagnosis not present

## 2016-05-30 DIAGNOSIS — Z170 Estrogen receptor positive status [ER+]: Secondary | ICD-10-CM | POA: Diagnosis not present

## 2016-05-30 DIAGNOSIS — M25612 Stiffness of left shoulder, not elsewhere classified: Secondary | ICD-10-CM | POA: Diagnosis not present

## 2016-05-30 DIAGNOSIS — Z79811 Long term (current) use of aromatase inhibitors: Secondary | ICD-10-CM | POA: Diagnosis not present

## 2016-05-30 NOTE — Therapy (Signed)
[place], [place], [address] Phone: [phone]   Fax:  [phone]  Physical Therapy Evaluation  Patient Details  Name: Melinda Wolfe MRN: [id number] Date of Birth: 04/14/1950 Referring Provider: Dr. Rolm Bookbinder  Encounter Date: 05/30/2016      PT End of Session - 05/30/16 1702    Visit Number 1   Number of Visits 1   PT Start Time 0539   PT Stop Time 1434   PT Time Calculation (min) 39 min   Activity Tolerance Patient tolerated treatment well   Behavior During Therapy [facility] for tasks assessed/performed      Past Medical History:  Diagnosis Date  . Depression   . GERD (gastroesophageal reflux disease)     Past Surgical History:  Procedure Laterality Date  . BREAST LUMPECTOMY WITH RADIOACTIVE SEED AND SENTINEL LYMPH NODE BIOPSY Left 05/02/2016   Procedure: BREAST LUMPECTOMY WITH RADIOACTIVE SEED AND SENTINEL LYMPH NODE BIOPSY;  Surgeon: Rolm Bookbinder, MD;  Location: [facility];  Service: General;  Laterality: Left;    There were no vitals filed for this visit.       Subjective Assessment - 05/30/16 1357    Subjective "[name] wants me to be evaluated because I had surgery on the left breast to remove a lump; I also had three lymph nodes taken."   Pertinent History Left breast seed-guided lumpectomy with 3 nodes removed on 05/02/16; will start radiation after Thursday, 05/06/16; will also be on antiestrogen. Otherwise healthy. She has just been doing her own stretches.   Patient Stated Goals see if she needs anything further   Currently in Pain? No/denies  just occasional quick pain            [facility] PT Assessment - 05/30/16 0001      Assessment   Medical Diagnosis left breast cancer with lumpectomy and SLNB   Referring Provider Dr. Rolm Bookbinder   Onset Date/Surgical Date 05/02/16   Hand Dominance Right   Prior Therapy none     Precautions   Precautions Other (comment)   Precaution Comments cancer precautions     Restrictions   Weight Bearing Restrictions No     Balance Screen   Has the patient fallen in the past 6 months No   Has the patient had a decrease in activity level because of a fear of falling?  No   Is the patient reluctant to leave their home because of a fear of falling?  No     Home Environment   Living Environment Private residence   Living Arrangements Spouse/significant other   Type of [place] One level     Prior Function   Level of Independence Independent   Vocation Full time employment   Runner, broadcasting/film/video   Leisure no regular exercise     Cognition   Overall Cognitive Status Within Functional Limits for tasks assessed     Observation/Other Assessments   Skin Integrity incisions are well-healed (two incisions, one for lumpectomy and one for lymph nodes);    Quick DASH  9.09     Posture/Postural Control   Posture/Postural Control Postural limitations   Postural Limitations Rounded Shoulders;Forward head  slight     ROM / Strength   AROM / PROM / Strength AROM;Strength     AROM   AROM Assessment Site Shoulder   Right/Left Shoulder Right;Left   Right Shoulder Extension 57 Degrees   Right  Shoulder Flexion 141 Degrees   Right Shoulder ABduction 180 Degrees   Right Shoulder Internal Rotation 50 Degrees  in sitting   Right Shoulder External Rotation 85 Degrees   Left Shoulder Extension 55 Degrees   Left Shoulder Flexion 141 Degrees   Left Shoulder ABduction 180 Degrees   Left Shoulder Internal Rotation 80 Degrees  in sitting   Left Shoulder External Rotation 76 Degrees     Strength   Overall Strength Comments right shoulder 5/5, left flexion and abduction 5/5 but rotations 5-/5     Palpation   Palpation comment lump incision is healed but feels firm right along the incision     Ambulation/Gait   Ambulation/Gait Yes   Ambulation/Gait Assistance  7: Independent           LYMPHEDEMA/ONCOLOGY QUESTIONNAIRE - 05/30/16 1418      Type   Cancer Type left breast     Surgeries   Lumpectomy Date 05/02/16   Number Lymph Nodes Removed 3     Lymphedema Assessments   Lymphedema Assessments Upper extremities     Right Upper Extremity Lymphedema   10 cm Proximal to Olecranon Process 31.9 cm   Olecranon Process 26.8 cm   10 cm Proximal to Ulnar Styloid Process 23.9 cm   Just Proximal to Ulnar Styloid Process 17.7 cm   Across Hand at [employer] 20 cm   At [place] of 2nd Digit 6.3 cm     Left Upper Extremity Lymphedema   10 cm Proximal to Olecranon Process 32.9 cm   Olecranon Process 27.7 cm   10 cm Proximal to Ulnar Styloid Process 24.7 cm   Just Proximal to Ulnar Styloid Process 17.8 cm   Across Hand at [employer] 19.9 cm   At [place] of 2nd Digit 6 cm           Quick Dash - 05/30/16 0001    Open a tight or new jar No difficulty   Do heavy household chores (wash walls, wash floors) Mild difficulty   Carry a shopping bag or briefcase Mild difficulty   Wash your back Mild difficulty   Use a knife to cut food No difficulty   Recreational activities in which you take some force or impact through your arm, shoulder, or hand (golf, hammering, tennis) No difficulty   During the past week, to what extent has your arm, shoulder or hand problem interfered with your normal social activities with family, friends, neighbors, or groups? Not at all   During the past week, to what extent has your arm, shoulder or hand problem limited your work or other regular daily activities Not at all   Arm, shoulder, or hand pain. Mild   Tingling (pins and needles) in your arm, shoulder, or hand None   Difficulty Sleeping No difficulty   DASH Score 9.09 %                     PT Education - 05/30/16 1701    Education provided Yes   Education Details about ABC class   Person(s) Educated Patient   Methods Explanation;Handout    Comprehension Verbalized understanding                [facility] Goals - 05/30/16 1706      CC Long Term Goal  #1   Title Patient will be knowledgeable about the After Breast Cancer class and that it would benefit her to attend.   Time  1   Period Days   Status Achieved            Plan - 05/30/16 1702    Clinical Impression Statement This is a pleasant woman who is four weeks s/p lumpectomy for left breast cancer with three lymph nodes removed. She is doing well with fairly good AROM, certainly for this stage of her recovery.  She is healing well.  She has a very low risk for lymphedema, but baseline measurements were taken today of both arms, which show little difference in size; therapist explained to her why this was done. I do think she would benefit from information that is provided at the [facility] class, so she was given info about that and encouraged to attend.  Eval today is low complexity.   Rehab Potential Excellent   PT Frequency One time visit   PT Treatment/Interventions Patient/family education   PT Next Visit Plan None at this time.   Recommended Other Services Patient was encouraged to attend the After Breast Cancer class for more information on exercise and lymphedema risk reduction.   Consulted and Agree with Plan of Care Patient      Patient will benefit from skilled therapeutic intervention in order to improve the following deficits and impairments:  Decreased range of motion, Decreased knowledge of precautions, Decreased knowledge of use of DME  Visit Diagnosis: Stiffness of left shoulder, not elsewhere classified - Plan: PT plan of care cert/re-cert  Aftercare following surgery for neoplasm - Plan: PT plan of care cert/re-cert      G-Codes - 40/10/27 1707    Functional Assessment Tool Used (Outpatient Only) quick DASH   Functional Limitation Carrying, moving and handling objects   Carrying, Moving and Handling Objects Current Status (O5366) At  least 1 percent but less than 20 percent impaired, limited or restricted   Carrying, Moving and Handling Objects Goal Status (Y4034) At least 1 percent but less than 20 percent impaired, limited or restricted   Carrying, Moving and Handling Objects Discharge Status [phone]) At least 1 percent but less than 20 percent impaired, limited or restricted       Problem List Patient Active Problem List   Diagnosis Date Noted  . Breast cancer of upper-outer quadrant of left female breast ([place]) 05/16/2016  . ASTHMA 11/11/2007  . 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   Remaining deficits: Mild ROM deficit. Pt. would benefit from attending ABC class.   Education / Equipment: About ABC class. Plan: Patient agrees to discharge.  Patient goals were met. Patient is being discharged due to meeting the stated rehab goals.  ?????    Serafina Royals, PT 05/30/16 5:10 PM

## 2016-08-15 DIAGNOSIS — Z853 Personal history of malignant neoplasm of breast: Secondary | ICD-10-CM

## 2016-08-15 HISTORY — DX: Personal history of malignant neoplasm of breast: Z85.3

## 2016-10-03 DIAGNOSIS — C50919 Malignant neoplasm of unspecified site of unspecified female breast: Secondary | ICD-10-CM | POA: Diagnosis not present

## 2017-03-28 ENCOUNTER — Other Ambulatory Visit: Payer: Self-pay | Admitting: Internal Medicine

## 2017-03-28 DIAGNOSIS — Z9889 Other specified postprocedural states: Secondary | ICD-10-CM

## 2017-04-08 DIAGNOSIS — Z923 Personal history of irradiation: Secondary | ICD-10-CM | POA: Diagnosis not present

## 2017-04-08 DIAGNOSIS — Z170 Estrogen receptor positive status [ER+]: Secondary | ICD-10-CM | POA: Diagnosis not present

## 2017-04-08 DIAGNOSIS — C50919 Malignant neoplasm of unspecified site of unspecified female breast: Secondary | ICD-10-CM | POA: Diagnosis not present

## 2017-04-08 DIAGNOSIS — Z79811 Long term (current) use of aromatase inhibitors: Secondary | ICD-10-CM | POA: Diagnosis not present

## 2017-04-16 ENCOUNTER — Other Ambulatory Visit: Payer: Self-pay | Admitting: General Surgery

## 2017-04-16 DIAGNOSIS — Z9889 Other specified postprocedural states: Secondary | ICD-10-CM

## 2017-04-17 ENCOUNTER — Ambulatory Visit
Admission: RE | Admit: 2017-04-17 | Discharge: 2017-04-17 | Disposition: A | Discharge status: Home / Self Care | Payer: Medicare Other | Source: Ambulatory Visit | Attending: Internal Medicine | Admitting: Internal Medicine

## 2017-04-17 DIAGNOSIS — Z9889 Other specified postprocedural states: Secondary | ICD-10-CM

## 2017-04-17 HISTORY — DX: Personal history of irradiation: Z92.3

## 2017-04-17 HISTORY — DX: Malignant neoplasm of unspecified site of unspecified female breast: C50.919

## 2017-10-02 DIAGNOSIS — Z79811 Long term (current) use of aromatase inhibitors: Secondary | ICD-10-CM

## 2017-10-02 DIAGNOSIS — C50919 Malignant neoplasm of unspecified site of unspecified female breast: Secondary | ICD-10-CM

## 2017-10-02 DIAGNOSIS — Z923 Personal history of irradiation: Secondary | ICD-10-CM | POA: Diagnosis not present

## 2017-10-02 DIAGNOSIS — M8580 Other specified disorders of bone density and structure, unspecified site: Secondary | ICD-10-CM

## 2017-10-02 DIAGNOSIS — Z170 Estrogen receptor positive status [ER+]: Secondary | ICD-10-CM

## 2017-11-07 DIAGNOSIS — R7303 Prediabetes: Secondary | ICD-10-CM

## 2017-11-07 HISTORY — DX: Prediabetes: R73.03

## 2018-02-02 DIAGNOSIS — C50919 Malignant neoplasm of unspecified site of unspecified female breast: Secondary | ICD-10-CM | POA: Diagnosis not present

## 2018-02-02 DIAGNOSIS — Z923 Personal history of irradiation: Secondary | ICD-10-CM

## 2018-02-02 DIAGNOSIS — Z170 Estrogen receptor positive status [ER+]: Secondary | ICD-10-CM

## 2018-02-02 DIAGNOSIS — Z79811 Long term (current) use of aromatase inhibitors: Secondary | ICD-10-CM | POA: Diagnosis not present

## 2018-04-06 ENCOUNTER — Other Ambulatory Visit: Payer: Self-pay | Admitting: Oncology

## 2018-04-06 ENCOUNTER — Telehealth: Payer: Self-pay | Admitting: Gastroenterology

## 2018-04-06 DIAGNOSIS — Z853 Personal history of malignant neoplasm of breast: Secondary | ICD-10-CM

## 2018-04-06 NOTE — Telephone Encounter (Signed)
Pt call in want to know when her next colon is due last colon 2015

## 2018-04-07 NOTE — Telephone Encounter (Signed)
Please review previous message and advise 

## 2018-04-21 ENCOUNTER — Ambulatory Visit
Admission: RE | Admit: 2018-04-21 | Discharge: 2018-04-21 | Disposition: A | Discharge status: Home / Self Care | Payer: Medicare Other | Source: Ambulatory Visit | Attending: Oncology | Admitting: Oncology

## 2018-04-21 DIAGNOSIS — Z853 Personal history of malignant neoplasm of breast: Secondary | ICD-10-CM

## 2018-04-22 NOTE — Telephone Encounter (Signed)
Melinda Wolfe Can you find out when is the recall from our old system and infrom. Also, add to the new recall log. If any problems, pl print out the last colon report/biopsies and let me know. Thanks RG

## 2018-04-23 NOTE — Telephone Encounter (Signed)
This is on your desk for review.

## 2018-04-26 NOTE — Telephone Encounter (Signed)
Rpt colon 04/2018 Can schedule routine

## 2018-04-27 ENCOUNTER — Encounter: Payer: Self-pay | Admitting: Gastroenterology

## 2018-04-27 NOTE — Telephone Encounter (Signed)
Recall colonoscopy has been scheduled with Patient in the [facility] for 05/21/2018 at 11:30am with Previsit.

## 2018-05-07 ENCOUNTER — Ambulatory Visit (AMBULATORY_SURGERY_CENTER): Payer: Self-pay

## 2018-05-07 VITALS — Ht 66.0 in | Wt 213.0 lb

## 2018-05-07 DIAGNOSIS — Z86010 Personal history of colonic polyps: Secondary | ICD-10-CM

## 2018-05-07 DIAGNOSIS — Z800 Family history of malignant neoplasm of digestive organs: Secondary | ICD-10-CM

## 2018-05-07 MED ORDER — NA SULFATE-K SULFATE-MG SULF 17.5-3.13-1.6 GM/177ML PO SOLN: 1.0000 | Freq: Once | ORAL | 0 refills | Status: AC

## 2018-05-07 NOTE — Progress Notes (Signed)
Per pt, no allergies to soy or egg products.Pt not taking any weight loss meds or using  O2 at home.  Pt refused emmi video. 

## 2018-05-21 ENCOUNTER — Encounter: Payer: Medicare Other | Admitting: Gastroenterology

## 2018-08-04 ENCOUNTER — Encounter: Payer: Self-pay | Admitting: Gastroenterology

## 2018-08-17 ENCOUNTER — Encounter: Payer: Self-pay | Admitting: Gastroenterology

## 2018-08-19 DIAGNOSIS — Z170 Estrogen receptor positive status [ER+]: Secondary | ICD-10-CM | POA: Diagnosis not present

## 2018-08-19 DIAGNOSIS — C50919 Malignant neoplasm of unspecified site of unspecified female breast: Secondary | ICD-10-CM | POA: Diagnosis not present

## 2018-08-29 IMAGING — US US BREAST BX W LOC DEV 1ST LESION IMG BX SPEC US GUIDE*L*
1 series · 10 of 10 positions shown · non-contrast
Comparison: Previous exam(s).

ADDENDUM:
Pathology revealed GRADE I INVASIVE DUCTAL CARCINOMA WITH
CALCIFICATIONS of the Left breast, 2:00 o'clock. This was found to
be concordant by Dr. Geibson De Holanda. Pathology results were discussed
with the patient by telephone. The patient reported doing well after
the biopsy with tenderness at the site. Post biopsy instructions and
care were reviewed and questions were answered. The patient was
encouraged to call The [REDACTED] for any
additional concerns. Surgical consultation has been arranged with
Dr. Donny Kariuki at [REDACTED] on April 17, 2016.

Pathology results reported by John Ariel Matareco, RN on 04/12/2016.
CLINICAL DATA: Patient with left breast mass/distortion presents
today for ultrasound-guided biopsy.
EXAM:
ULTRASOUND GUIDED LEFT BREAST CORE NEEDLE BIOPSY

[Series 1: us breast bx w loc dev 1st lesion img bx spec us g · 0.06mm/px · 10 of 10 slices shown]
[im 1/10]
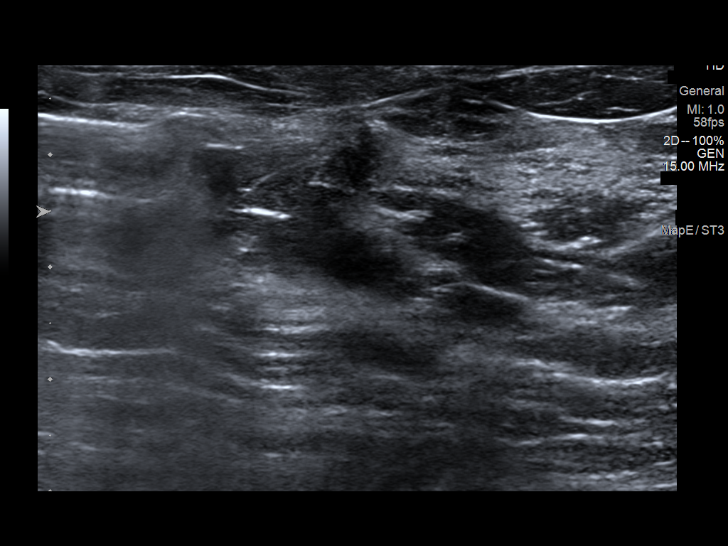
[im 2/10]
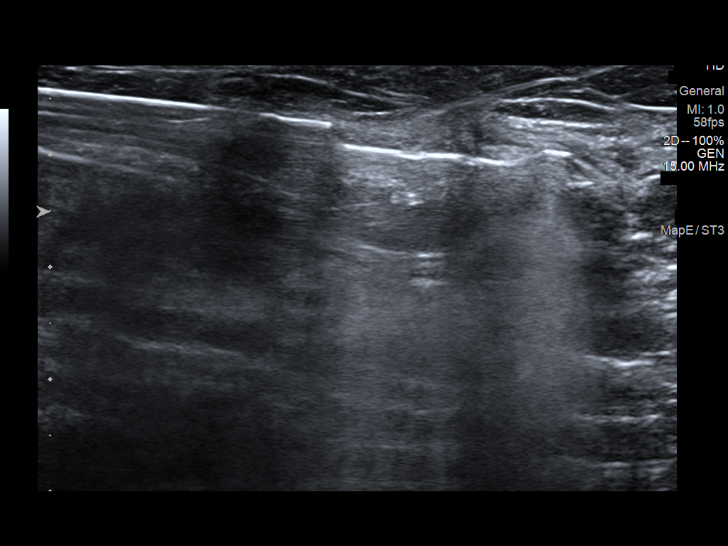
[im 3/10]
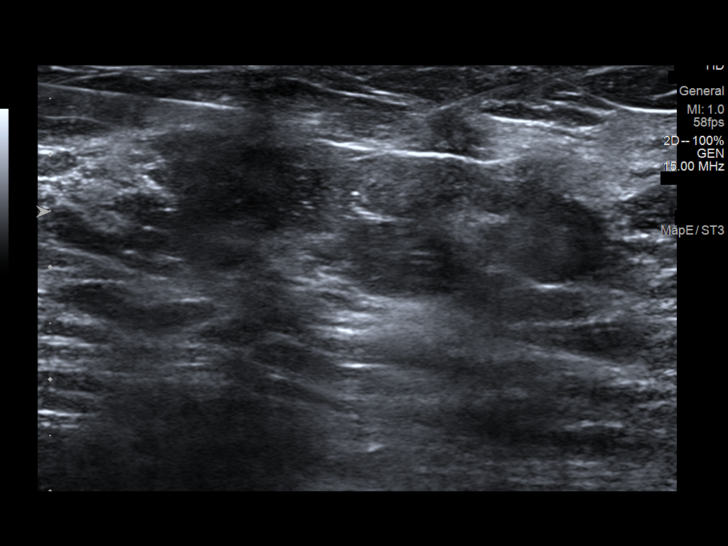
[im 4/10]
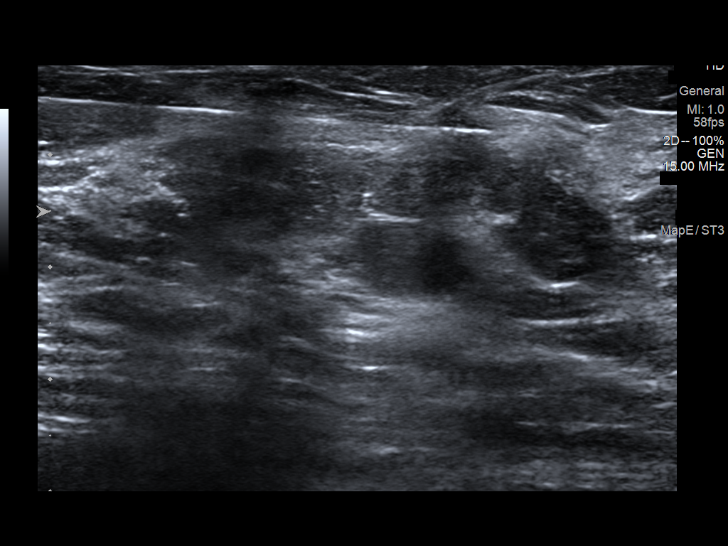
[im 5/10]
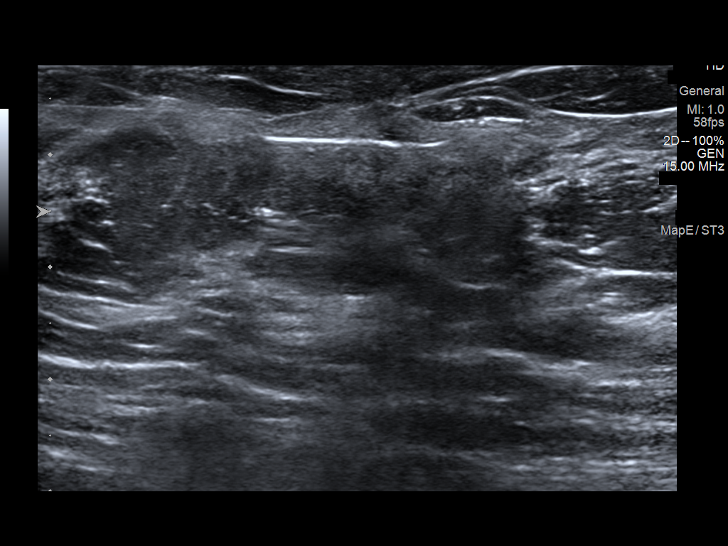
[im 6/10]
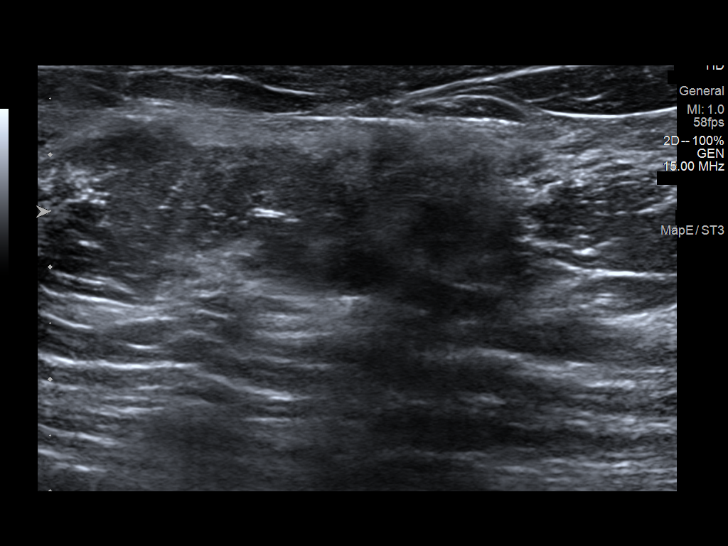
[im 7/10]
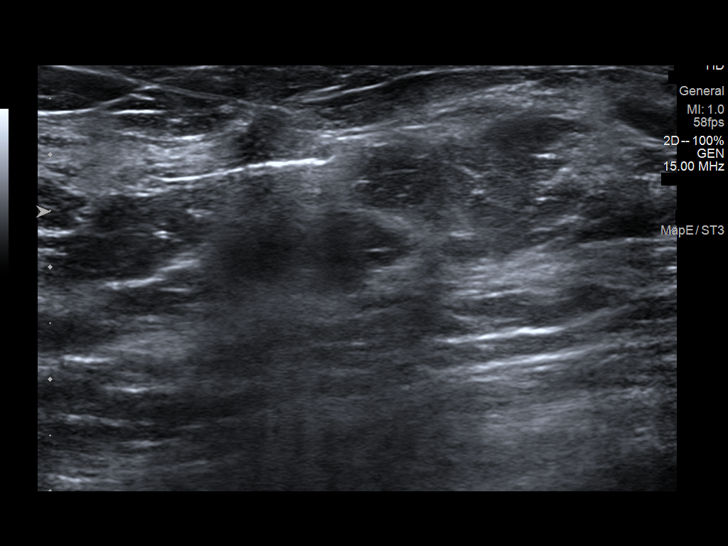
[im 8/10]
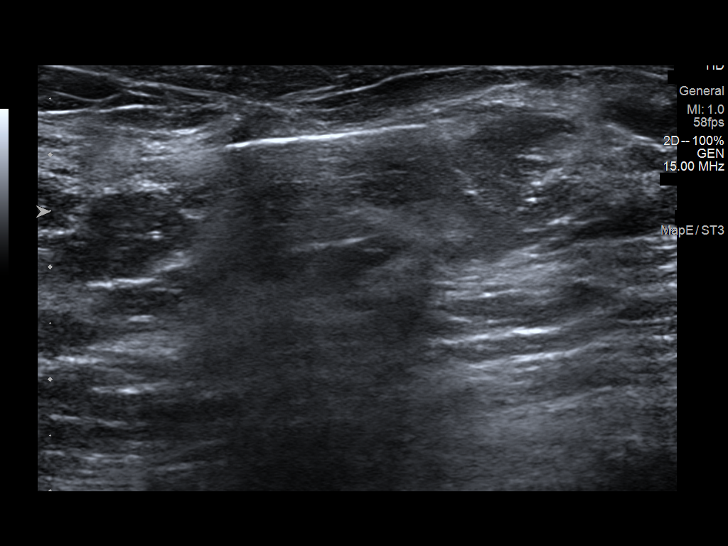
[im 9/10]
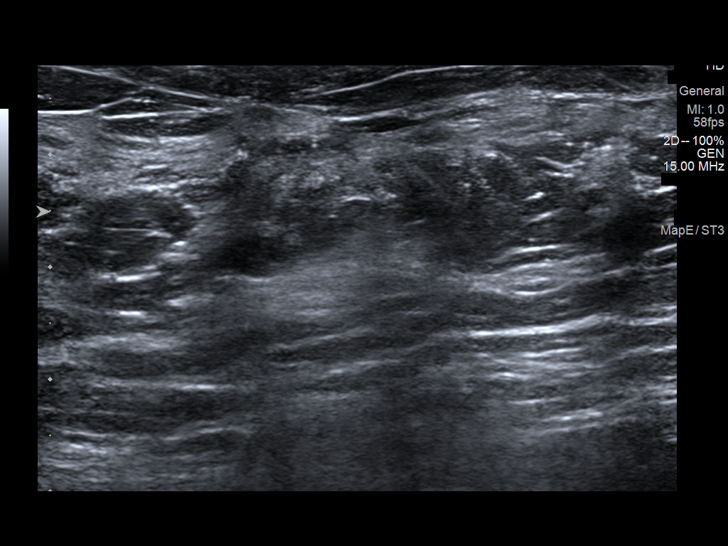
[im 10/10]
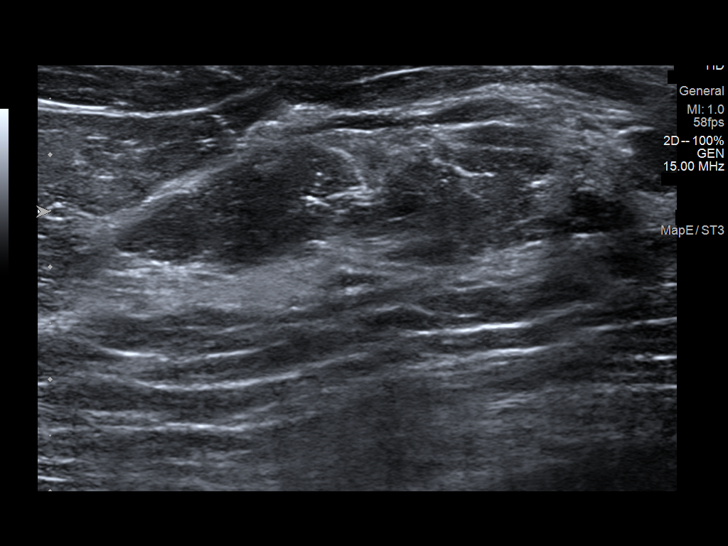

[10 of 10 positions shown; findings below may reference images not displayed]

PROCEDURE:
I met with the patient and we discussed the procedure of
ultrasound-guided biopsy, including benefits and alternatives. We
discussed the high likelihood of a successful procedure. We
discussed the risks of the procedure including infection, bleeding,
tissue injury, clip migration, and inadequate sampling. Informed
written consent was given. The usual time-out protocol was performed
immediately prior to the procedure.

Using sterile technique and 1% Lidocaine as local anesthetic, under
direct ultrasound visualization, a 12 gauge Forciniti device was
used to perform biopsy of the shadowing mass in the left breast at
the 2 o'clock axis, 6 cm from the nipple,using a lateral approach.
At the conclusion of the procedure, a ribbon shaped tissue marker
clip was deployed into the biopsy cavity. Follow-up 2-view mammogram
was performed and dictated separately.
IMPRESSION: Ultrasound-guided biopsy of the mass/distortion within the left
breast at the 2 o'clock axis, 6 cm from the nipple. No apparent
complications.

## 2018-08-29 IMAGING — MG MM CLIP PLACEMENT
8 series · 8 of 16 positions shown · non-contrast
Comparison: Previous exam(s).

CLINICAL DATA: Status post ultrasound-guided biopsy today for a
left breast mass/distortion at the 2 o'clock axis.

EXAM:
DIAGNOSTIC LEFT MAMMOGRAM POST ULTRASOUND BIOPSY

[L CC (1 of 2)]
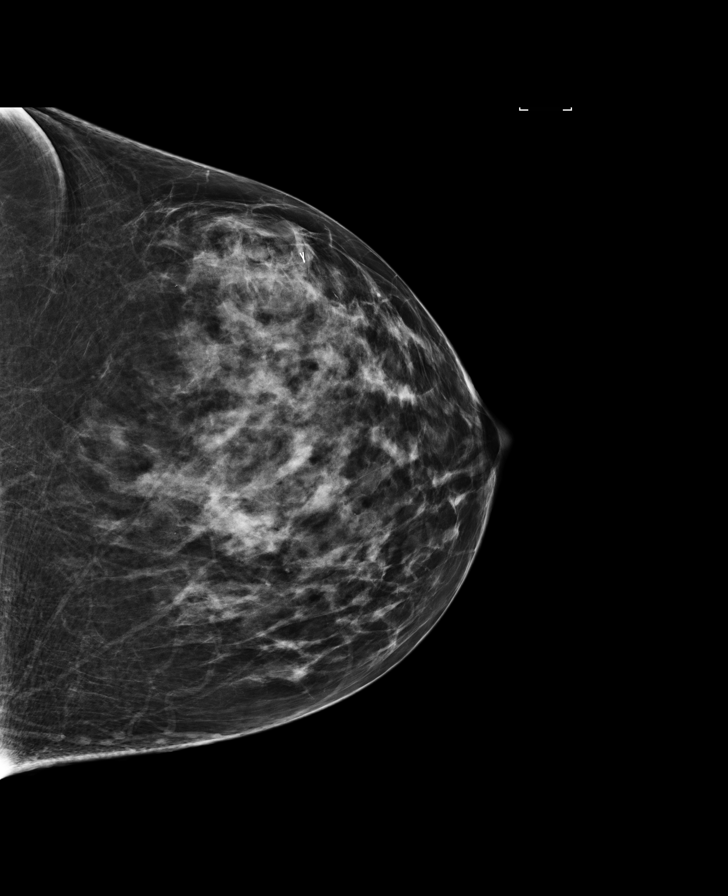

[L ML (1 of 2)]
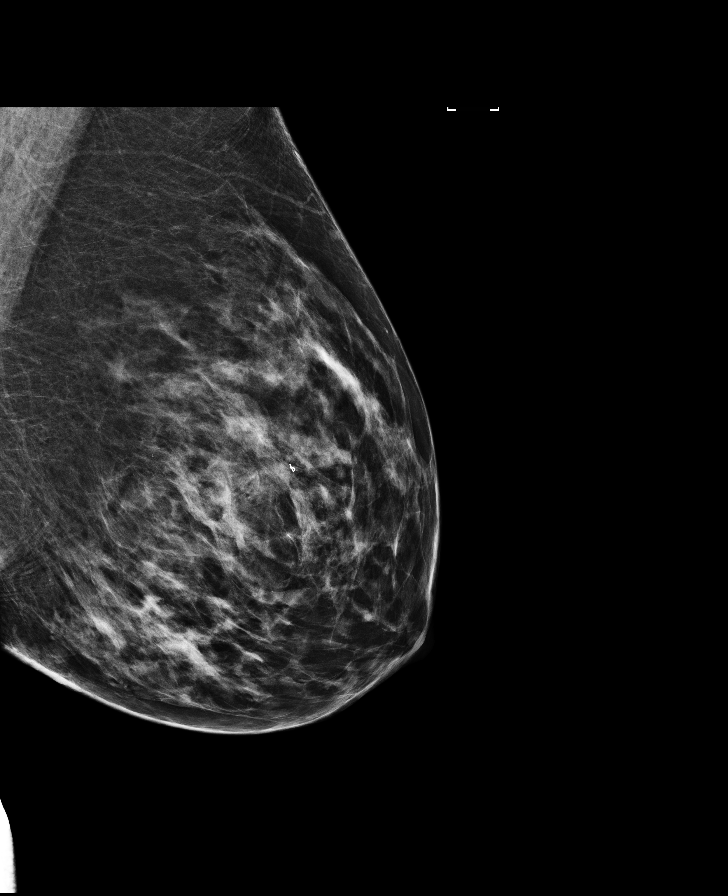

[L ML synth-2D]
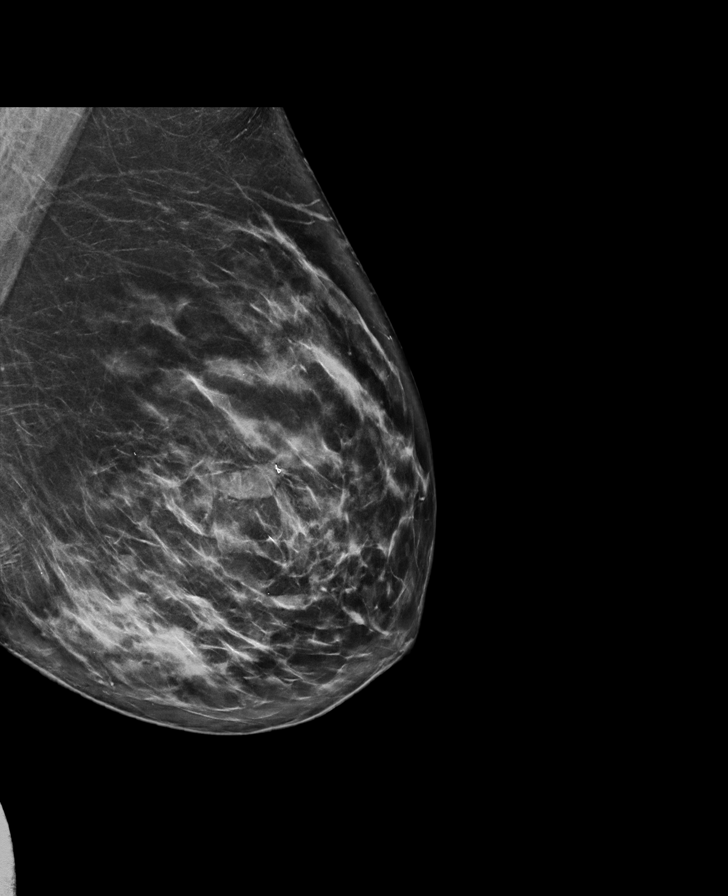

[L CC (2 of 2)]
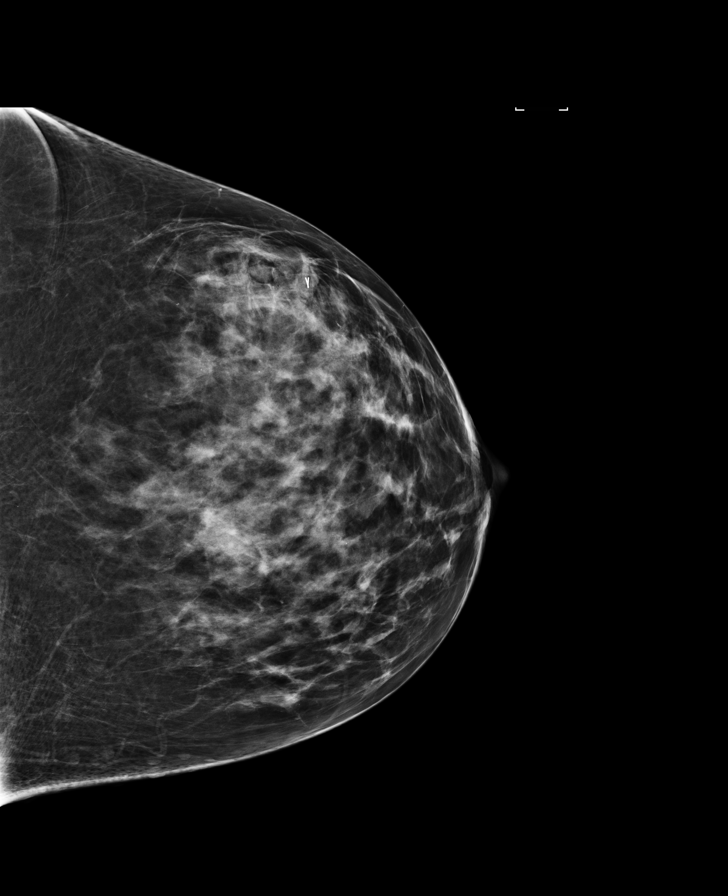

[L ML (2 of 2)]
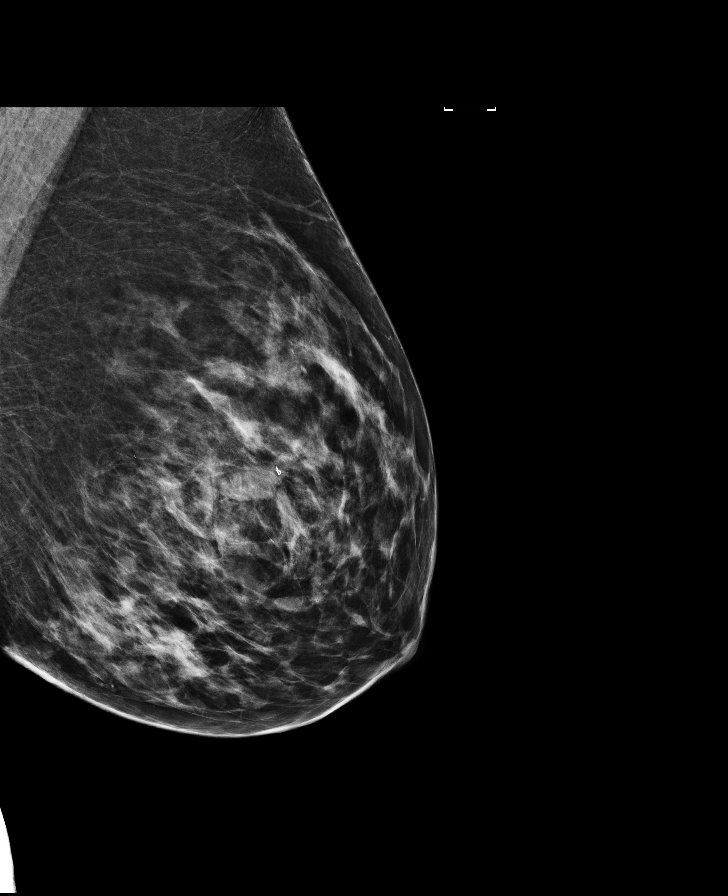

[L CC synth-2D]
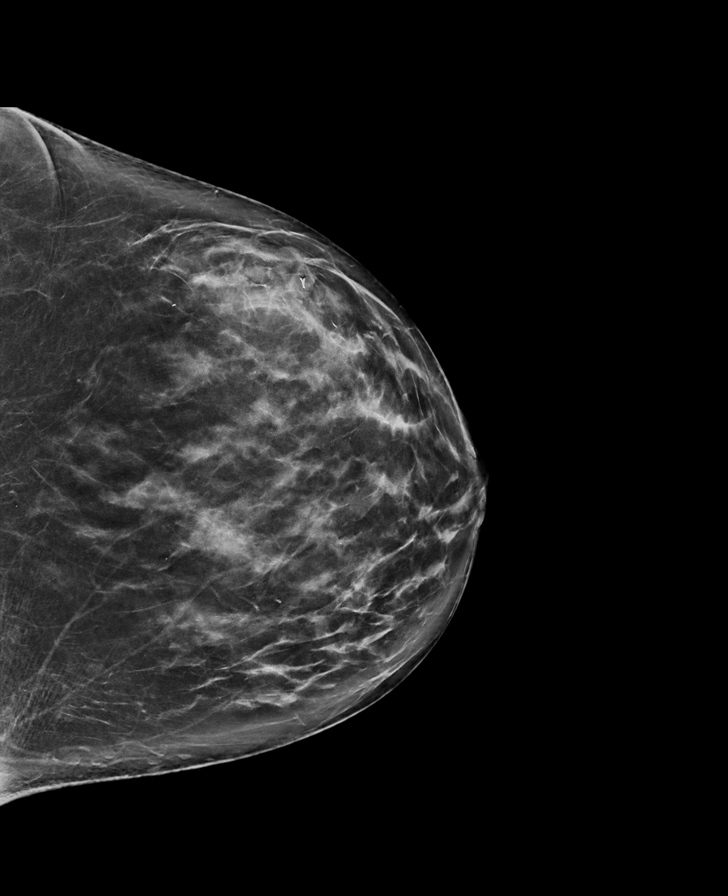

[L CC tomo · tomo slice 45/88.0]
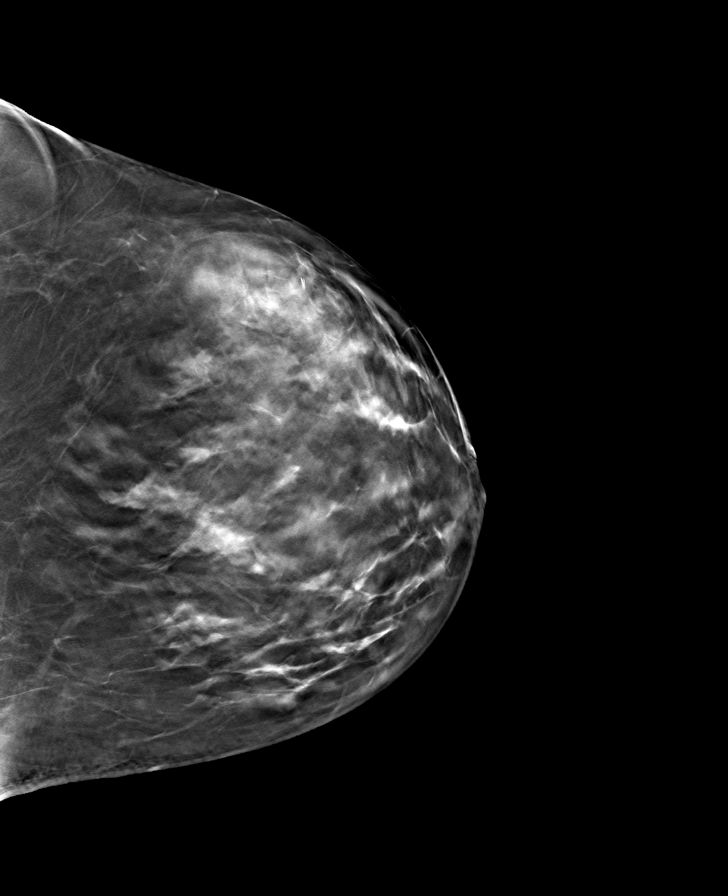

[L ML tomo · tomo slice 46/91.0]
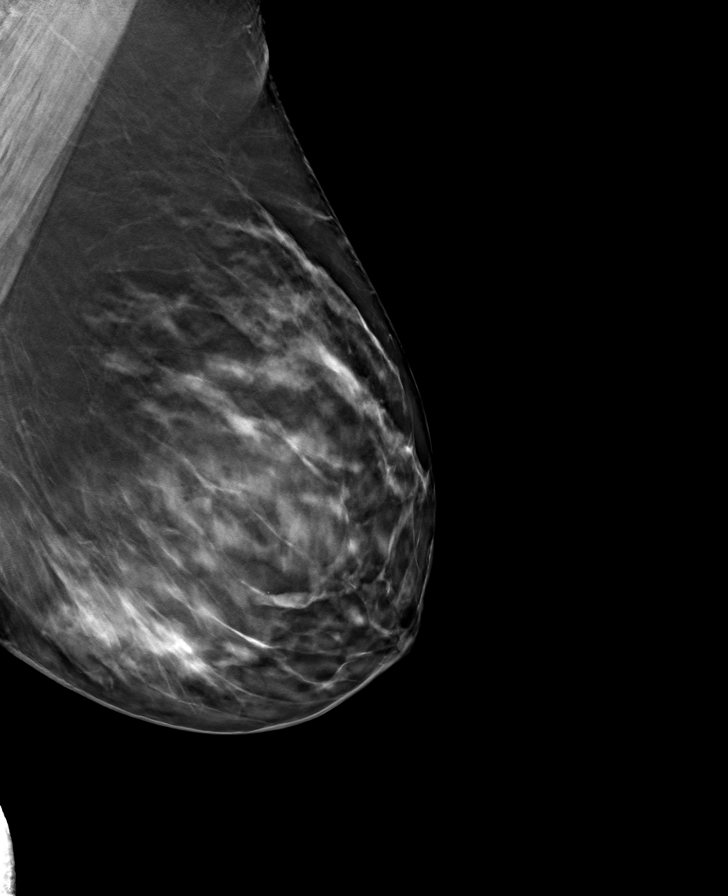

[8 of 16 positions shown; findings below may reference images not displayed]

FINDINGS: Mammographic images were obtained following ultrasound guided biopsy
of the left breast mass/distortion at the 2 o'clock axis, 6 cm from
the nipple. At the conclusion of the procedure, a ribbon shaped
tissue marker was placed at the biopsy site.

Biopsy clip is adequately positioned at the inferomedial margin of
the targeted mass/distortion and corresponds to the original
mammographic finding.
IMPRESSION: Ribbon shaped biopsy clip is adequately positioned at the
inferomedial margin of the targeted mass/distortion. The targeted
mass today corresponds to the original mammographic finding.

Final Assessment: Post Procedure Mammograms for Marker Placement

## 2018-09-07 ENCOUNTER — Ambulatory Visit (AMBULATORY_SURGERY_CENTER): Payer: Medicare Other | Admitting: *Deleted

## 2018-09-07 ENCOUNTER — Other Ambulatory Visit: Payer: Self-pay

## 2018-09-07 VITALS — Ht 66.0 in | Wt 194.0 lb

## 2018-09-07 DIAGNOSIS — Z800 Family history of malignant neoplasm of digestive organs: Secondary | ICD-10-CM

## 2018-09-07 DIAGNOSIS — Z86010 Personal history of colonic polyps: Secondary | ICD-10-CM

## 2018-09-07 NOTE — Progress Notes (Signed)
No egg or soy allergy known to patient  No issues with past sedation with any surgeries  or procedures, no intubation problems - pt states she is hard to wake up post op  No diet pills per patient No home 02 use per patient  No blood thinners per patient  Pt denies issues with constipation  No A fib or A flutter  EMMI video sent to pt's e mail   Pt verified name, DOB, address and insurance during PV today. Pt mailed instruction packet to included paper to complete and mail back to [facility] with addressed and stamped envelope, Emmi video, copy of consent form to read and not return, and instructions.PV completed over the phone. Pt encouraged to call with questions or issues - Pt has a suprep kit at home from 05-2018 PV  Pt is aware that care partner will wait in the car during proceudre; if they feel like they will be too hot to wait in the car; they may wait in the lobby.  We want them to wear a mask (we do not have any that we can provide them), practice social distancing, and we will check their temperatures when they get here.  I did remind patient that their care partner needs to stay in the parking lot the entire time. Pt will wear mask into building.

## 2018-09-18 ENCOUNTER — Telehealth: Payer: Self-pay | Admitting: Gastroenterology

## 2018-09-18 NOTE — Telephone Encounter (Signed)

## 2018-09-21 ENCOUNTER — Other Ambulatory Visit: Payer: Self-pay

## 2018-09-21 ENCOUNTER — Encounter: Payer: Self-pay | Admitting: Gastroenterology

## 2018-09-21 ENCOUNTER — Ambulatory Visit (AMBULATORY_SURGERY_CENTER): Payer: Medicare Other | Admitting: Gastroenterology

## 2018-09-21 VITALS — BP 169/80 | HR 85 | Temp 98.5°F | Resp 14 | Ht 66.0 in | Wt 194.0 lb

## 2018-09-21 DIAGNOSIS — Z86010 Personal history of colonic polyps: Secondary | ICD-10-CM

## 2018-09-21 DIAGNOSIS — D125 Benign neoplasm of sigmoid colon: Secondary | ICD-10-CM | POA: Diagnosis not present

## 2018-09-21 MED ORDER — SODIUM CHLORIDE 0.9 % IV SOLN: 500.0000 mL | Freq: Once | INTRAVENOUS | Status: DC

## 2018-09-21 NOTE — Patient Instructions (Signed)
YOU HAD AN ENDOSCOPIC PROCEDURE TODAY AT [facility]:   Refer to the procedure report that was given to you for any specific questions about what was found during the examination.  If the procedure report does not answer your questions, please call your gastroenterologist to clarify.  If you requested that your care partner not be given the details of your procedure findings, then the procedure report has been included in a sealed envelope for you to review at your convenience later.  YOU SHOULD EXPECT: Some feelings of bloating in the abdomen. Passage of more gas than usual.  Walking can help get rid of the air that was put into your GI tract during the procedure and reduce the bloating. If you had a lower endoscopy (such as a colonoscopy or flexible sigmoidoscopy) you may notice spotting of blood in your stool or on the toilet paper. If you underwent a bowel prep for your procedure, you may not have a normal bowel movement for a few days.  Please Note:  You might notice some irritation and congestion in your nose or some drainage.  This is from the oxygen used during your procedure.  There is no need for concern and it should clear up in a day or so.  SYMPTOMS TO REPORT IMMEDIATELY:   Following lower endoscopy (colonoscopy or flexible sigmoidoscopy):  Excessive amounts of blood in the stool  Significant tenderness or worsening of abdominal pains  Swelling of the abdomen that is new, acute  Fever of 100F or higher    For urgent or emergent issues, a gastroenterologist can be reached at any hour by calling [phone].   DIET:  We do recommend a small meal at first, but then you may proceed to your regular diet.  Drink plenty of fluids but you should avoid alcoholic beverages for 24 hours.  ACTIVITY:  You should plan to take it easy for the rest of today and you should NOT DRIVE or use heavy machinery until tomorrow (because of the sedation medicines used during the test).     FOLLOW UP: Our staff will call the number listed on your records 48-72 hours following your procedure to check on you and address any questions or concerns that you may have regarding the information given to you following your procedure. If we do not reach you, we will leave a message.  We will attempt to reach you two times.  During this call, we will ask if you have developed any symptoms of COVID 19. If you develop any symptoms (ie: fever, flu-like symptoms, shortness of breath, cough etc.) before then, please call [phone].  If you test positive for Covid 19 in the 2 weeks post procedure, please call and report this information to [place].    If any biopsies were taken you will be contacted by phone or by letter within the next 1-3 weeks.  Please call us at [phone] if you have not heard about the biopsies in 3 weeks.    SIGNATURES/CONFIDENTIALITY: You and/or your care partner have signed paperwork which will be entered into your electronic medical record.  These signatures attest to the fact that that the information above on your After Visit Summary has been reviewed and is understood.  Full responsibility of the confidentiality of this discharge information lies with you and/or your care-partner.    Handouts were given to your care partner on polyps, diverticulosis, hemorrhoids,  and a high fiber diet with liberal fluid intake. You may  resume your current medications today. Await biopsy results. Please call if any questions or concerns.   

## 2018-09-21 NOTE — Progress Notes (Signed)
No problems noted in the recovery room. maw 

## 2018-09-21 NOTE — Progress Notes (Signed)
Pt's states no medical or surgical changes since previsit or office visit. 

## 2018-09-21 NOTE — Progress Notes (Signed)
PT taken to PACU. Monitors in place. VSS. Report given to RN. 

## 2018-09-21 NOTE — Op Note (Signed)
[facility] Patient Name: Melinda Wolfe Procedure Date: 09/21/2018 11:05 AM MRN: [id number] Endoscopist: Jackquline Denmark , MD Age: 68 Referring MD:  Date of Birth: May 23, 1950 Gender: Female Account #: [id number] Procedure:                Colonoscopy Indications:              High risk colon cancer surveillance: Personal                            history of colonic polyps Medicines:                Monitored Anesthesia Care Procedure:                Pre-Anesthesia Assessment:                           - Prior to the procedure, a History and Physical                            was performed, and patient medications and                            allergies were reviewed. The patient's tolerance of                            previous anesthesia was also reviewed. The risks                            and benefits of the procedure and the sedation                            options and risks were discussed with the patient.                            All questions were answered, and informed consent                            was obtained. Prior Anticoagulants: The patient has                            taken no previous anticoagulant or antiplatelet                            agents. ASA Grade Assessment: II - A patient with                            mild systemic disease. After reviewing the risks                            and benefits, the patient was deemed in                            satisfactory condition to undergo the procedure.  After obtaining informed consent, the colonoscope                            was passed under direct vision. Throughout the                            procedure, the patient's blood pressure, pulse, and                            oxygen saturations were monitored continuously. The                            Model PCF-[id number]) scope was introduced                            through the anus and advanced to the 2  cm into the                            ileum. The colonoscopy was performed without                            difficulty. The patient tolerated the procedure                            well. The quality of the bowel preparation was                            good. The ileocecal valve, appendiceal orifice, and                            rectum were photographed. Scope In: 11:16:02 AM Scope Out: 11:35:19 AM Scope Withdrawal Time: 0 hours 7 minutes 52 seconds  Total Procedure Duration: 0 hours 19 minutes 17 seconds  Findings:                 A 6 mm polyp was found in the distal sigmoid colon,                            25 cm from the anal verge. The polyp was sessile.                            The polyp was removed with a cold snare. Resection                            and retrieval were complete. Estimated blood loss:                            none.                           Multiple small and large-mouthed diverticula were                            found in the sigmoid colon and  rare in ascending                            colon.                           Non-bleeding internal hemorrhoids were found during                            retroflexion. The hemorrhoids were small.                           The exam was otherwise without abnormality on                            direct and retroflexion views. Complications:            No immediate complications. Estimated Blood Loss:     Estimated blood loss: none. Impression:               - One 6 mm polyp in the distal sigmoid colon,                            removed with a cold snare. Resected and retrieved.                           - Moderate predominantly sigmoid diverticulosis.                           - Otherwise normal colonoscopy. Recommendation:           - Patient has a contact number available for                            emergencies. The signs and symptoms of potential                            delayed complications were  discussed with the                            patient. Return to normal activities tomorrow.                            Written discharge instructions were provided to the                            patient.                           - High fiber diet.                           - Continue present medications.                           - Await pathology results.                           -  Repeat colonoscopy for surveillance based on                            pathology results.                           - Return to GI office PRN. Jackquline Denmark, MD 09/21/2018 11:40:31 AM This report has been signed electronically.

## 2018-09-21 NOTE — Progress Notes (Signed)
Fayrene Fearing took temp and Rica Mote took vitals.

## 2018-09-21 NOTE — Progress Notes (Signed)
Called to room to assist during endoscopic procedure.  Patient ID and intended procedure confirmed with present staff. Received instructions for my participation in the procedure from the performing physician.  

## 2018-09-23 ENCOUNTER — Telehealth: Payer: Self-pay | Admitting: *Deleted

## 2018-09-23 NOTE — Telephone Encounter (Signed)
  Follow up Call-  Call back number 09/21/2018  Post procedure Call Back phone  # [phone]  Permission to leave phone message Yes  Some recent data might be hidden     Patient questions:  Do you have a fever, pain , or abdominal swelling? No. Pain Score  0 *  Have you tolerated food without any problems? Yes.    Have you been able to return to your normal activities? Yes.    Do you have any questions about your discharge instructions: Diet   No. Medications  No. Follow up visit  No.  Do you have questions or concerns about your Care? No.  Actions: * If pain score is 4 or above: No action needed, pain <4.   1. Have you developed a fever since your procedure? no  2.   Have you had an respiratory symptoms (SOB or cough) since your procedure? no  3.   Have you tested positive for COVID 19 since your procedure no  4.   Have you had any family members/close contacts diagnosed with the COVID 19 since your procedure?  no   If yes to any of these questions please route to Joylene John, RN and Alphonsa Gin, Therapist, sports.

## 2018-09-26 ENCOUNTER — Encounter: Payer: Self-pay | Admitting: Gastroenterology

## 2018-12-30 DIAGNOSIS — M169 Osteoarthritis of hip, unspecified: Secondary | ICD-10-CM | POA: Insufficient documentation

## 2018-12-30 HISTORY — DX: Osteoarthritis of hip, unspecified: M16.9

## 2019-02-18 DIAGNOSIS — Z170 Estrogen receptor positive status [ER+]: Secondary | ICD-10-CM | POA: Diagnosis not present

## 2019-02-18 DIAGNOSIS — Z79811 Long term (current) use of aromatase inhibitors: Secondary | ICD-10-CM | POA: Diagnosis not present

## 2019-02-18 DIAGNOSIS — C50919 Malignant neoplasm of unspecified site of unspecified female breast: Secondary | ICD-10-CM | POA: Diagnosis not present

## 2019-03-18 ENCOUNTER — Ambulatory Visit (INDEPENDENT_AMBULATORY_CARE_PROVIDER_SITE_OTHER): Payer: Medicare Other | Admitting: Cardiology

## 2019-03-18 ENCOUNTER — Encounter: Payer: Self-pay | Admitting: *Deleted

## 2019-03-18 ENCOUNTER — Other Ambulatory Visit: Payer: Self-pay

## 2019-03-18 ENCOUNTER — Other Ambulatory Visit: Payer: Self-pay | Admitting: *Deleted

## 2019-03-18 VITALS — BP 140/86 | HR 90 | Ht 66.0 in | Wt 218.6 lb

## 2019-03-18 DIAGNOSIS — E782 Mixed hyperlipidemia: Secondary | ICD-10-CM

## 2019-03-18 DIAGNOSIS — R079 Chest pain, unspecified: Secondary | ICD-10-CM | POA: Diagnosis not present

## 2019-03-18 DIAGNOSIS — R7303 Prediabetes: Secondary | ICD-10-CM

## 2019-03-18 DIAGNOSIS — R9431 Abnormal electrocardiogram [ECG] [EKG]: Secondary | ICD-10-CM

## 2019-03-18 HISTORY — DX: Abnormal electrocardiogram (ECG) (EKG): R94.31

## 2019-03-18 NOTE — Patient Instructions (Addendum)
Medication Instructions:  Your physician recommends that you continue on your current medications as directed. Please refer to the Current Medication list given to you today.  *If you need a refill on your cardiac medications before your next appointment, please call your pharmacy*  Lab Work: None If you have labs (blood work) drawn today and your tests are completely normal, you will receive your results only by: Marland Kitchen MyChart Message (if you have MyChart) OR . A paper copy in the mail If you have any lab test that is abnormal or we need to change your treatment, we will call you to review the results.  Testing/Procedures: Your physician has requested that you have a lexiscan myoview. For further information please visit [email or web address]. Please follow instruction sheet, as given.   Follow-Up: At [facility], you and your health needs are our priority.  As part of our continuing mission to provide you with exceptional heart care, we have created designated Provider Care Teams.  These Care Teams include your primary Cardiologist (physician) and Advanced Practice Providers (APPs -  Physician Assistants and Nurse Practitioners) who all work together to provide you with the care you need, when you need it.  Your next appointment:   3 month(s)  The format for your next appointment:   In Person  Provider:   Berniece Salines, DO  Other Instructions  Cardiac Nuclear Scan A cardiac nuclear scan is a test that is done to check the flow of blood to your heart. It is done when you are resting and when you are exercising. The test looks for problems such as:  Not enough blood reaching a portion of the heart.  The heart muscle not working as it should. You may need this test if:  You have heart disease.  You have had lab results that are not normal.  You have had heart surgery or a balloon procedure to open up blocked arteries (angioplasty).  You have chest pain.  You have shortness of  breath. In this test, a special dye (tracer) is put into your bloodstream. The tracer will travel to your heart. A camera will then take pictures of your heart to see how the tracer moves through your heart. This test is usually done at a hospital and takes 2-4 hours. Tell a doctor about:  Any allergies you have.  All medicines you are taking, including vitamins, herbs, eye drops, creams, and over-the-counter medicines.  Any problems you or family members have had with anesthetic medicines.  Any blood disorders you have.  Any surgeries you have had.  Any medical conditions you have.  Whether you are pregnant or may be pregnant. What are the risks? Generally, this is a safe test. However, problems may occur, such as:  Serious chest pain and heart attack. This is only a risk if the stress portion of the test is done.  Rapid heartbeat.  A feeling of warmth in your chest. This feeling usually does not last long.  Allergic reaction to the tracer. What happens before the test?  Ask your doctor about changing or stopping your normal medicines. This is important.  Follow instructions from your doctor about what you cannot eat or drink.  Remove your jewelry on the day of the test. What happens during the test?  An IV tube will be inserted into one of your veins.  Your doctor will give you a small amount of tracer through the IV tube.  You will wait for 20-40 minutes while the tracer  moves through your bloodstream.  Your heart will be monitored with an electrocardiogram (ECG).  You will lie down on an exam table.  Pictures of your heart will be taken for about 15-20 minutes.  You may also have a stress test. For this test, one of these things may be done: ? You will be asked to exercise on a treadmill or a stationary bike. ? You will be given medicines that will make your heart work harder. This is done if you are unable to exercise.  When blood flow to your heart has  peaked, a tracer will again be given through the IV tube.  After 20-40 minutes, you will get back on the exam table. More pictures will be taken of your heart.  Depending on the tracer that is used, more pictures may need to be taken 3-4 hours later.  Your IV tube will be removed when the test is over. The test may vary among doctors and hospitals. What happens after the test?  Ask your doctor: ? Whether you can return to your normal schedule, including diet, activities, and medicines. ? Whether you should drink more fluids. This will help to remove the tracer from your body. Drink enough fluid to keep your pee (urine) pale yellow.  Ask your doctor, or the department that is doing the test: ? When will my results be ready? ? How will I get my results? Summary  A cardiac nuclear scan is a test that is done to check the flow of blood to your heart.  Tell your doctor whether you are pregnant or may be pregnant.  Before the test, ask your doctor about changing or stopping your normal medicines. This is important.  Ask your doctor whether you can return to your normal activities. You may be asked to drink more fluids. This information is not intended to replace advice given to you by your health care provider. Make sure you discuss any questions you have with your health care provider. Document Revised: 06/10/2018 Document Reviewed: 08/04/2017 Elsevier Patient Education  [place].

## 2019-03-18 NOTE — Progress Notes (Signed)
Cardiology Office Note:    Date:  03/18/2019   ID:  Melinda Wolfe, DOB Oct 17, 1950, MRN [id number]  PCP:  Raina Mina., MD  Cardiologist:  No primary care provider on file.  Electrophysiologist:  None   Referring MD: Raina Mina., MD   The patient was referred for preoperative clearance  History of Present Illness:    Melinda Wolfe is a 69 y.o. female with a hx of anxiety, hyperlipidemia intolerant to statin, prediabetes, GERD presents today to be evaluated preoperatively.  Patient states that she is planning to go for hip surgery in February but yesterday was at her PCP office to reported that her EKG was abnormal therefore the patient was recommended to come see cardiology.  She is not active due to her hip pain and therefore is unable to tell me if there is any exertional chest pain, shortness of breath.  Past Medical History:  Diagnosis Date  . Anxiety 07/19/2015   Last Assessment & Plan:  Relevant Hx: Course: Daily Update: Today's Plan:this is stable for her at this time and will follow on her current dose of meds  Electronically signed by: Mayer Camel, NP 07/20/15 1230  . Asthma    uses an inhaler  . ASTHMA 11/11/2007   Qualifier: Diagnosis of  By: Julien Girt CMA, Marliss Czar    . Breast cancer ([place]) 2018   Left Breast Cancer/ 22 radiation treatments  . Breast cancer of upper-outer quadrant of left female breast ([place]) 05/16/2016  . Complication of anesthesia    per pt,hard to wake up after sedation!  . Depression   . Gastroesophageal reflux disease without esophagitis 07/20/2015   Last Assessment & Plan:  Relevant Hx: Course: Daily Update: Today's Plan:she has been stable on the PPI  But she is trying to wean this down to qod which is fine for her to do  Electronically signed by: Mayer Camel, NP 07/20/15 1235  . GERD (gastroesophageal reflux disease)   . History of breast cancer 08/15/2016  . Hypercalcemia 07/19/2015  . Malaise and  fatigue 07/19/2015   Last Assessment & Plan:  Relevant Hx: Course: Daily Update: Today's Plan:this is off and on for her and mainly with her work that is very stressful for her despite being there 30 years it is still quite hard on her  Electronically signed by: Mayer Camel, NP 07/20/15 1231  . Mild intermittent asthma without complication 0000000   Last Assessment & Plan:  Relevant Hx: Course: Daily Update: Today's Plan:this is stable for her at this time and she has inhaler to use if she needs to but she has not had to in some time  Electronically signed by: Mayer Camel, NP 07/20/15 1230  . Mixed hyperlipidemia 07/19/2015   Last Assessment & Plan:  Relevant Hx: Course: Daily Update: Today's Plan:she is going to have her lipids checked she cannot take statins due to the fact that she has such severe myalagias with them  Electronically signed by: Mayer Camel, NP 07/20/15 1231  . Osteoarthritis of hip 12/30/2018  . Personal history of radiation therapy 2018   Left Breast Cancer/ 22 treatments  . Prediabetes 11/07/2017  . Recurrent major depressive disorder ([place]) 07/19/2015   Last Assessment & Plan:  Relevant Hx: Course: Daily Update: Today's Plan:she feels this is stable for her and will follow  Electronically signed by: Mayer Camel, NP 07/20/15 1231  . 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 Vitamin D deficiency 07/19/2015   Last Assessment & Plan:  Relevant Hx: Course: Daily Update: Today's Plan:update her level for her today she is taking her vitamin d daily now and will continue with this  Electronically signed by: Mayer Camel, NP 07/20/15 1232    Past Surgical History:  Procedure Laterality Date  . BREAST LUMPECTOMY Left 05/2016  . BREAST LUMPECTOMY WITH RADIOACTIVE SEED AND SENTINEL LYMPH NODE BIOPSY Left 05/02/2016   Procedure: BREAST LUMPECTOMY WITH RADIOACTIVE SEED AND SENTINEL LYMPH  NODE BIOPSY;  Surgeon: Rolm Bookbinder, MD;  Location: [facility];  Service: General;  Laterality: Left;  . COLONOSCOPY    . POLYPECTOMY      Current Medications: Current Meds  Medication Sig  . acetaminophen (TYLENOL) 325 MG tablet Take 650 mg by mouth every 6 (six) hours as needed.  Marland Kitchen alendronate (FOSAMAX) 70 MG tablet alendronate 70 mg tablet  . anastrozole (ARIMIDEX) 1 MG tablet Take 1 mg by mouth daily.  Marland Kitchen aspirin EC 81 MG tablet Take 81 mg by mouth daily.  . budesonide-formoterol (SYMBICORT) 160-4.5 MCG/ACT inhaler Inhale 2 puffs into the lungs 2 (two) times daily.  . Cholecalciferol (VITAMIN D3) 1000 units CAPS Take by mouth daily.  . montelukast (SINGULAIR) 10 MG tablet Take 10 mg by mouth at bedtime.  . naproxen sodium (ALEVE) 220 MG tablet Take by mouth.  Marland Kitchen omeprazole (PRILOSEC) 20 MG capsule Take 20 mg by mouth daily.  Marland Kitchen PARoxetine (PAXIL) 40 MG tablet Take 40 mg by mouth every morning.  . vitamin B-12 (CYANOCOBALAMIN) 100 MCG tablet Take 100 mcg by mouth daily.     Allergies:   Latex, Levofloxacin, and Statins   Social History   Socioeconomic History  . Marital status: Married    Spouse name: Not on file  . Number of children: Not on file  . Years of education: Not on file  . Highest education level: Not on file  Occupational History  . Not on file  Tobacco Use  . Smoking status: Former Smoker    Quit date: 08/21/2012    Years since quitting: 6.5  . Smokeless tobacco: Never Used  Substance and Sexual Activity  . Alcohol use: Yes    Comment: social  . Drug use: No  . Sexual activity: Not on file  Other Topics Concern  . Not on file  Social History Narrative  . Not on file   Social Determinants of Health   Financial Resource Strain:   . Difficulty of Paying Living Expenses: Not on file  Food Insecurity:   . Worried About Charity fundraiser in the Last Year: Not on file  . Ran Out of Food in the Last Year: Not on file  Transportation  Needs:   . Lack of Transportation (Medical): Not on file  . Lack of Transportation (Non-Medical): Not on file  Physical Activity:   . Days of Exercise per Week: Not on file  . Minutes of Exercise per Session: Not on file  Stress:   . Feeling of Stress : Not on file  Social Connections:   . Frequency of Communication with Friends and Family: Not on file  . Frequency of Social Gatherings with Friends and Family: Not on file  . Attends Religious Services: Not on file  . Active Member of Clubs or Organizations: Not on file  . Attends Archivist Meetings: Not on file  . Marital Status: Not on file  Family History: The patient's family history includes Colon cancer in her mother; Hypertension in her sister; Stroke in her father. There is no history of Breast cancer, Colon polyps, Esophageal cancer, Rectal cancer, or Stomach cancer.  ROS:   Review of Systems  Constitution: Negative for decreased appetite, fever and weight gain.  HENT: Negative for congestion, ear discharge, hoarse voice and sore throat.   Eyes: Negative for discharge, redness, vision loss in right eye and visual halos.  Cardiovascular: Negative for chest pain, dyspnea on exertion, leg swelling, orthopnea and palpitations.  Respiratory: Negative for cough, hemoptysis, shortness of breath and snoring.   Endocrine: Negative for heat intolerance and polyphagia.  Hematologic/Lymphatic: Negative for bleeding problem. Does not bruise/bleed easily.  Skin: Negative for flushing, nail changes, rash and suspicious lesions.  Musculoskeletal: Negative for arthritis, joint pain, muscle cramps, myalgias, neck pain and stiffness.  Gastrointestinal: Negative for abdominal pain, bowel incontinence, diarrhea and excessive appetite.  Genitourinary: Negative for decreased libido, genital sores and incomplete emptying.  Neurological: Negative for brief paralysis, focal weakness, headaches and loss of balance.   Psychiatric/Behavioral: Negative for altered mental status, depression and suicidal ideas.  Allergic/Immunologic: Negative for HIV exposure and persistent infections.    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  Recent Labs: No results found for requested labs within last 8760 hours.  Recent Lipid Panel No results found for: CHOL, TRIG, HDL, CHOLHDL, VLDL, LDLCALC, LDLDIRECT  Physical Exam:    VS:  BP 140/86 (BP Location: Right Arm)   Pulse 90   Ht 5\' 6"  (1.676 m)   Wt 218 lb 9.6 oz (99.2 kg)   BMI 35.28 kg/m     Wt Readings from Last 3 Encounters:  03/18/19 218 lb 9.6 oz (99.2 kg)  09/21/18 194 lb (88 kg)  09/07/18 194 lb (88 kg)     GEN: Well nourished, well developed in no acute distress HEENT: Normal NECK: No JVD; No carotid bruits LYMPHATICS: No lymphadenopathy CARDIAC: S1S2 noted,RRR, no murmurs, rubs, gallops RESPIRATORY:  Clear to auscultation without rales, wheezing or rhonchi  ABDOMEN: Soft, non-tender, non-distended, +bowel sounds, no guarding. EXTREMITIES: No edema, No cyanosis, no clubbing MUSCULOSKELETAL:  No edema; No deformity  SKIN: Warm and dry NEUROLOGIC:  Alert and oriented x 3, non-focal PSYCHIATRIC:  Normal affect, good insight  ASSESSMENT:    1. Abnormal EKG   2. Mixed hyperlipidemia   3. Prediabetes   4. Chest pain of uncertain etiology    PLAN:     Due to abnormal EKG and her risk factors for coronary artery disease which includes prediabetes, hyperlipidemia I like to pursue ischemic evaluation in this patient prior to her hip surgery.  Pharmacologic nuclear stress test will be appropriate at this time.  I discussed this with the patient she is in agreement.  If her stress test is normal she can proceed with her hip surgery.  Hyperlipidemia-intolerant to statin  she tells me that her lipids have been coming down based on diet I requested to get lab work from her PCP.  Prediabetes modification advised.  The patient is in agreement with the above plan. The patient left the office in stable condition.  The patient will follow up in 3 months or sooner if needed   Medication Adjustments/Labs and Tests Ordered: Current medicines are reviewed at length  with the patient today.  Concerns regarding medicines are outlined above.  Orders Placed This Encounter  Procedures  . MYOCARDIAL PERFUSION IMAGING  . EKG 12-Lead   No orders of the defined types were placed in this encounter.   Patient Instructions  Medication Instructions:  Your physician recommends that you continue on your current medications as directed. Please refer to the Current Medication list given to you today.  *If you need a refill on your cardiac medications before your next appointment, please call your pharmacy*  Lab Work: None If you have labs (blood work) drawn today and your tests are completely normal, you will receive your results only by: Marland Kitchen MyChart Message (if you have MyChart) OR . A paper copy in the mail If you have any lab test that is abnormal or we need to change your treatment, we will call you to review the results.  Testing/Procedures: Your physician has requested that you have a lexiscan myoview. For further information please visit [email or web address]. Please follow instruction sheet, as given.   Follow-Up: At [facility], you and your health needs are our priority.  As part of our continuing mission to provide you with exceptional heart care, we have created designated Provider Care Teams.  These Care Teams include your primary Cardiologist (physician) and Advanced Practice Providers (APPs -  Physician Assistants and Nurse Practitioners) who all work together to provide you with the care you need, when you need it.  Your next appointment:   3 month(s)  The  format for your next appointment:   In Person  Provider:   Berniece Salines, DO  Other Instructions  Cardiac Nuclear Scan A cardiac nuclear scan is a test that is done to check the flow of blood to your heart. It is done when you are resting and when you are exercising. The test looks for problems such as:  Not enough blood reaching a portion of the heart.  The heart muscle not working as it should. You may need this test if:  You have heart disease.  You have had lab results that are not normal.  You have had heart surgery or a balloon procedure to open up blocked arteries (angioplasty).  You have chest pain.  You have shortness of breath. In this test, a special dye (tracer) is put into your bloodstream. The tracer will travel to your heart. A camera will then take pictures of your heart to see how the tracer moves through your heart. This test is usually done at a hospital and takes 2-4 hours. Tell a doctor about:  Any allergies you have.  All medicines you are taking, including vitamins, herbs, eye drops, creams, and over-the-counter medicines.  Any problems you or family members have had with anesthetic medicines.  Any blood disorders you have.  Any surgeries you have had.  Any medical conditions you have.  Whether you are pregnant or may be pregnant. What are the risks? Generally, this is a safe test. However, problems may occur, such as:  Serious chest pain and heart attack. This is only a risk if the stress portion of the test is done.  Rapid heartbeat.  A feeling of warmth in your chest. This feeling usually does not last long.  Allergic reaction to the tracer. What happens before the test?  Ask your doctor about changing or stopping your normal medicines. This is important.  Follow instructions from your doctor about what you cannot eat or drink.  Remove your jewelry on the day  of the test. What happens during the test?  An IV tube will be inserted  into one of your veins.  Your doctor will give you a small amount of tracer through the IV tube.  You will wait for 20-40 minutes while the tracer moves through your bloodstream.  Your heart will be monitored with an electrocardiogram (ECG).  You will lie down on an exam table.  Pictures of your heart will be taken for about 15-20 minutes.  You may also have a stress test. For this test, one of these things may be done: ? You will be asked to exercise on a treadmill or a stationary bike. ? You will be given medicines that will make your heart work harder. This is done if you are unable to exercise.  When blood flow to your heart has peaked, a tracer will again be given through the IV tube.  After 20-40 minutes, you will get back on the exam table. More pictures will be taken of your heart.  Depending on the tracer that is used, more pictures may need to be taken 3-4 hours later.  Your IV tube will be removed when the test is over. The test may vary among doctors and hospitals. What happens after the test?  Ask your doctor: ? Whether you can return to your normal schedule, including diet, activities, and medicines. ? Whether you should drink more fluids. This will help to remove the tracer from your body. Drink enough fluid to keep your pee (urine) pale yellow.  Ask your doctor, or the department that is doing the test: ? When will my results be ready? ? How will I get my results? Summary  A cardiac nuclear scan is a test that is done to check the flow of blood to your heart.  Tell your doctor whether you are pregnant or may be pregnant.  Before the test, ask your doctor about changing or stopping your normal medicines. This is important.  Ask your doctor whether you can return to your normal activities. You may be asked to drink more fluids. This information is not intended to replace advice given to you by your health care provider. Make sure you discuss any questions you  have with your health care provider. Document Revised: 06/10/2018 Document Reviewed: 08/04/2017 Elsevier Patient Education  [place].      Adopting a Healthy Lifestyle.  Know what a healthy weight is for you (roughly BMI <25) and aim to maintain this   Aim for 7+ servings of fruits and vegetables daily   65-80+ fluid ounces of water or unsweet tea for healthy kidneys   Limit to max 1 drink of alcohol per day; avoid smoking/tobacco   Limit animal fats in diet for cholesterol and heart health - choose grass fed whenever available   Avoid highly processed foods, and foods high in saturated/trans fats   Aim for low stress - take time to unwind and care for your mental health   Aim for 150 min of moderate intensity exercise weekly for heart health, and weights twice weekly for bone health   Aim for 7-9 hours of sleep daily   When it comes to diets, agreement about the perfect plan isnt easy to find, even among the experts. Experts at the [employer] developed an idea known as the Healthy Eating Plate. Just imagine a plate divided into logical, healthy portions.   The emphasis is on diet quality:   Load up on  vegetables and fruits - one-half of your plate: Aim for color and variety, and remember that potatoes dont count.   Go for whole grains - one-quarter of your plate: Whole wheat, barley, wheat berries, quinoa, oats, brown rice, and foods made with them. If you want pasta, go with whole wheat pasta.   Protein power - one-quarter of your plate: Fish, chicken, beans, and nuts are all healthy, versatile protein sources. Limit red meat.   The diet, however, does go beyond the plate, offering a few other suggestions.   Use healthy plant oils, such as olive, canola, soy, corn, sunflower and peanut. Check the labels, and avoid partially hydrogenated oil, which have unhealthy trans fats.   If youre thirsty, drink water. Coffee and tea are good in  moderation, but skip sugary drinks and limit milk and dairy products to one or two daily servings.   The type of carbohydrate in the diet is more important than the amount. Some sources of carbohydrates, such as vegetables, fruits, whole grains, and beans-are healthier than others.   Finally, stay active  Signed, Berniece Salines, DO  03/18/2019 1:26 PM    [facility]

## 2019-03-25 ENCOUNTER — Encounter: Payer: Self-pay | Admitting: Cardiology

## 2019-03-25 ENCOUNTER — Inpatient Hospital Stay (HOSPITAL_COMMUNITY): Admission: RE | Admit: 2019-03-25 | Payer: Medicare Other | Source: Ambulatory Visit

## 2019-03-25 DIAGNOSIS — R079 Chest pain, unspecified: Secondary | ICD-10-CM | POA: Diagnosis not present

## 2019-03-29 ENCOUNTER — Telehealth: Payer: Self-pay | Admitting: Cardiology

## 2019-03-29 NOTE — Telephone Encounter (Signed)
Telephone call to patient.States waiting for results of [place] done at [facility]. Will print off and have Dr Harriet Masson review.

## 2019-03-29 NOTE — Telephone Encounter (Signed)
Telephone call back to patient. Informed of lexiscan results. Informed Dr Maureen Ralphs will need to fax a clearance form for Dr Harriet Masson to sign. Pt states she will call their office now to get the form

## 2019-03-29 NOTE — Telephone Encounter (Signed)
Patient states Dr.Tobb was to call her today.  She was told if she didn't hear from Dr. Harriet Masson by lunch time to call the office.

## 2019-04-07 ENCOUNTER — Ambulatory Visit: Admit: 2019-04-07 | Payer: Medicare Other | Admitting: Orthopedic Surgery

## 2019-04-07 SURGERY — ARTHROPLASTY, HIP, TOTAL, ANTERIOR APPROACH
Anesthesia: Choice | Site: Hip | Laterality: Left

## 2019-05-12 DIAGNOSIS — Z8616 Personal history of COVID-19: Secondary | ICD-10-CM

## 2019-05-12 HISTORY — DX: Personal history of COVID-19: Z86.16

## 2019-05-12 NOTE — H&P (Signed)
TOTAL HIP ADMISSION H&P  Patient is admitted for left total hip arthroplasty.  Subjective:  Chief Complaint: Left hip pain  HPI: Melinda Wolfe, 69 y.o. female, has a history of pain and functional disability in the left hip due to arthritis and patient has failed non-surgical conservative treatments for greater than 12 weeks to include corticosteriod injections and activity modification. Onset of symptoms was abrupt, starting 1 years ago with gradually worsening course since that time. The patient noted no past surgery on the left hip. Patient currently rates pain in the left hip at 7 out of 10 with activity. Patient has worsening of pain with activity and weight bearing, pain that interfers with activities of daily living and instability. Patient has evidence of bone-on-bone arthritis with subchondral cystic formation and osteophyte formation by imaging studies. This condition presents safety issues increasing the risk of falls. There is no current active infection.  Patient Active Problem List   Diagnosis Date Noted  . Abnormal EKG 03/18/2019  . Osteoarthritis of hip 12/30/2018  . Prediabetes 11/07/2017  . History of breast cancer 08/15/2016  . Breast cancer of upper-outer quadrant of left female breast ([place]) 05/16/2016  . Gastroesophageal reflux disease without esophagitis 07/20/2015  . Anxiety 07/19/2015  . Hypercalcemia 07/19/2015  . Malaise and fatigue 07/19/2015  . Mild intermittent asthma without complication [id number]  . Mixed hyperlipidemia 07/19/2015  . Recurrent major depressive disorder ([place]) 07/19/2015  . Vitamin D deficiency 07/19/2015  . ASTHMA 11/11/2007  . 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 Anxiety 07/19/2015   Last Assessment & Plan:  Relevant Hx: Course: Daily Update: Today's Plan:this is stable for her at this time and will follow on her current dose of meds  Electronically signed by: Mayer Camel, NP 07/20/15  1230  . Asthma    uses an inhaler  . ASTHMA 11/11/2007   Qualifier: Diagnosis of  By: Julien Girt CMA, Marliss Czar    . Breast cancer ([place]) 2018   Left Breast Cancer/ 22 radiation treatments  . Breast cancer of upper-outer quadrant of left female breast ([place]) 05/16/2016  . Complication of anesthesia    per pt,hard to wake up after sedation!  . Depression   . Gastroesophageal reflux disease without esophagitis 07/20/2015   Last Assessment & Plan:  Relevant Hx: Course: Daily Update: Today's Plan:she has been stable on the PPI  But she is trying to wean this down to qod which is fine for her to do  Electronically signed by: Mayer Camel, NP 07/20/15 1235  . GERD (gastroesophageal reflux disease)   . History of breast cancer 08/15/2016  . Hypercalcemia 07/19/2015  . Malaise and fatigue 07/19/2015   Last Assessment & Plan:  Relevant Hx: Course: Daily Update: Today's Plan:this is off and on for her and mainly with her work that is very stressful for her despite being there 30 years it is still quite hard on her  Electronically signed by: Mayer Camel, NP 07/20/15 1231  . Mild intermittent asthma without complication 0000000   Last Assessment & Plan:  Relevant Hx: Course: Daily Update: Today's Plan:this is stable for her at this time and she has inhaler to use if she needs to but she has not had to in some time  Electronically signed by: Mayer Camel, NP 07/20/15 1230  . Mixed hyperlipidemia 07/19/2015   Last Assessment & Plan:  Relevant Hx: Course: Daily Update: Today's Plan:she is going to have her lipids  checked she cannot take statins due to the fact that she has such severe myalagias with them  Electronically signed by: Mayer Camel, NP 07/20/15 1231  . Osteoarthritis of hip 12/30/2018  . Personal history of radiation therapy 2018   Left Breast Cancer/ 22 treatments  . Prediabetes 11/07/2017  . Recurrent major depressive disorder ([place]) 07/19/2015   Last  Assessment & Plan:  Relevant Hx: Course: Daily Update: Today's Plan:she feels this is stable for her and will follow  Electronically signed by: Mayer Camel, NP 07/20/15 1231  . 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 Vitamin D deficiency 07/19/2015   Last Assessment & Plan:  Relevant Hx: Course: Daily Update: Today's Plan:update her level for her today she is taking her vitamin d daily now and will continue with this  Electronically signed by: Mayer Camel, NP 07/20/15 1232    Past Surgical History:  Procedure Laterality Date  . BREAST LUMPECTOMY Left 05/2016  . BREAST LUMPECTOMY WITH RADIOACTIVE SEED AND SENTINEL LYMPH NODE BIOPSY Left 05/02/2016   Procedure: BREAST LUMPECTOMY WITH RADIOACTIVE SEED AND SENTINEL LYMPH NODE BIOPSY;  Surgeon: Rolm Bookbinder, MD;  Location: [facility];  Service: General;  Laterality: Left;  . COLONOSCOPY    . POLYPECTOMY      Prior to Admission medications   Medication Sig Start Date End Date Taking? Authorizing Provider  acetaminophen (TYLENOL) 325 MG tablet Take 650 mg by mouth every 6 (six) hours as needed.    [provider]  alendronate (FOSAMAX) 70 MG tablet alendronate 70 mg tablet    [provider]  alendronate-cholecalciferol (FOSAMAX PLUS D) 70-2800 MG-UNIT tablet Take 1 tablet by mouth every 7 (seven) days. Take with a full glass of water on an empty stomach.    [provider]  anastrozole (ARIMIDEX) 1 MG tablet Take 1 mg by mouth daily. 08/21/18   [provider]  aspirin EC 81 MG tablet Take 81 mg by mouth daily.    [provider]  budesonide-formoterol (SYMBICORT) 160-4.5 MCG/ACT inhaler Inhale 2 puffs into the lungs 2 (two) times daily.    [provider]  Cholecalciferol (VITAMIN D3) 1000 units CAPS Take by mouth daily.    [provider]  montelukast (SINGULAIR) 10 MG tablet Take 10 mg by mouth at  bedtime.    [provider]  naproxen sodium (ALEVE) 220 MG tablet Take by mouth.    [provider]  omeprazole (PRILOSEC) 20 MG capsule Take 20 mg by mouth daily.    [provider]  PARoxetine (PAXIL) 40 MG tablet Take 40 mg by mouth every morning.    [provider]  vitamin B-12 (CYANOCOBALAMIN) 100 MCG tablet Take 100 mcg by mouth daily.    [provider]    Allergies  Allergen Reactions  . Latex     Caused blisters  . Levofloxacin Nausea Only  . Statins     Elevated LFT's    Social History   Socioeconomic History  . Marital status: Married    Spouse name: Not on file  . Number of children: Not on file  . Years of education: Not on file  . Highest education level: Not on file  Occupational History  . Not on file  Tobacco Use  . Smoking status: Former Smoker    Quit date: 08/21/2012    Years since quitting: 6.7  . Smokeless tobacco: Never Used  Substance  and Sexual Activity  . Alcohol use: Yes    Comment: social  . Drug use: No  . Sexual activity: Not on file  Other Topics Concern  . Not on file  Social History Narrative  . Not on file   Social Determinants of Health   Financial Resource Strain:   . Difficulty of Paying Living Expenses: Not on file  Food Insecurity:   . Worried About Charity fundraiser in the Last Year: Not on file  . Ran Out of Food in the Last Year: Not on file  Transportation Needs:   . Lack of Transportation (Medical): Not on file  . Lack of Transportation (Non-Medical): Not on file  Physical Activity:   . Days of Exercise per Week: Not on file  . Minutes of Exercise per Session: Not on file  Stress:   . Feeling of Stress : Not on file  Social Connections:   . Frequency of Communication with Friends and Family: Not on file  . Frequency of Social Gatherings with Friends and Family: Not on file  . Attends Religious Services: Not on file  . Active Member of Clubs or Organizations: Not on  file  . Attends Archivist Meetings: Not on file  . Marital Status: Not on file  Intimate Partner Violence:   . Fear of Current or Ex-Partner: Not on file  . Emotionally Abused: Not on file  . Physically Abused: Not on file  . Sexually Abused: Not on file      Tobacco Use: Medium Risk  . Smoking Tobacco Use: Former Smoker  . Smokeless Tobacco Use: Never Used   Social History   Substance and Sexual Activity  Alcohol Use Yes   Comment: social    Family History  Problem Relation Age of Onset  . Colon cancer Mother   . Stroke Father   . Hypertension Sister   . Breast cancer Neg Hx   . Colon polyps Neg Hx   . Esophageal cancer Neg Hx   . Rectal cancer Neg Hx   . Stomach cancer Neg Hx     Review of Systems  Constitutional: Negative for chills and fever.  HENT: Negative for congestion, sore throat and tinnitus.   Eyes: Negative for double vision, photophobia and pain.  Respiratory: Negative for cough, shortness of breath and wheezing.   Cardiovascular: Negative for chest pain, palpitations and orthopnea.  Gastrointestinal: Negative for heartburn, nausea and vomiting.  Genitourinary: Negative for dysuria, frequency and urgency.  Musculoskeletal: Positive for joint pain.  Neurological: Negative for dizziness, weakness and headaches.    Objective:  Physical Exam: Well nourished and well developed. General: Alert and oriented x3, cooperative and pleasant, no acute distress. Head: normocephalic, atraumatic, neck supple. Eyes: EOMI. Respiratory: breath sounds clear in all fields, no wheezing, rales, or rhonchi. Cardiovascular: Regular rate and rhythm, no murmurs, gallops or rubs.  Abdomen: non-tender to palpation and soft, normoactive bowel sounds.  Musculoskeletal: Left Hip Exam: ROM: Flexion to 90, Internal Rotation 0, External Rotation 0, and abduction 20 without discomfort. There is no tenderness over the greater trochanter bursa.  Calves soft and  nontender. Motor function intact in LE. Strength 5/5 LE bilaterally. Neuro: Distal pulses 2+. Sensation to light touch intact in LE.   Vital signs in last 24 hours: Blood pressure: 118/82 mmHg  Imaging Review Plain radiographs demonstrate severe degenerative joint disease of the left hip. The bone quality appears to be adequate for age and reported activity level.  Assessment/Plan:  End stage arthritis, left hip  The patient history, physical examination, clinical judgement of the provider and imaging studies are consistent with end stage degenerative joint disease of the left hip and total hip arthroplasty is deemed medically necessary. The treatment options including medical management, injection therapy, arthroscopy and arthroplasty were discussed at length. The risks and benefits of total hip arthroplasty were presented and reviewed. The risks due to aseptic loosening, infection, stiffness, dislocation/subluxation, thromboembolic complications and other imponderables were discussed. The patient acknowledged the explanation, agreed to proceed with the plan and consent was signed. Patient is being admitted for inpatient treatment for surgery, pain control, PT, OT, prophylactic antibiotics, VTE prophylaxis, progressive ambulation and ADLs and discharge planning.The patient is planning to be discharged home.   Patient's anticipated LOS is less than 2 midnights, meeting these requirements: - Younger than 52 - Lives within 1 hour of care - Has a competent adult at home to recover with post-op recover - NO history of  - Chronic pain requiring opiods  - Diabetes  - Coronary Artery Disease  - Heart failure  - Heart attack  - Stroke  - DVT/VTE  - Cardiac arrhythmia  - Respiratory Failure/COPD  - Renal failure  - Anemia  - Advanced Liver disease  Therapy Plans: HEP Disposition: Home with husband Planned DVT Prophylaxis: Xarelto 10 mg daily (hx breast CA) DME Needed: [facility] PCP:  Edyth Gunnels, NP (clearance received) TXA: IV Allergies: Levaquin Anesthesia Concerns: None BMI: 35.4  Other:  - Possible SDD - Hx breast CA, s/p lumpectomy w/ radiation in 2018.  - Patient was instructed on what medications to stop prior to surgery. - Follow-up visit in 2 weeks with Dr. Wynelle Link - Begin physical therapy following surgery - Pre-operative lab work as pre-surgical testing - Prescriptions will be provided in hospital at time of discharge  Theresa Duty, PA-C Orthopedic Surgery [facility] [place]

## 2019-05-14 NOTE — Patient Instructions (Addendum)
DUE TO COVID-19 ONLY ONE VISITOR IS ALLOWED TO COME WITH YOU AND STAY IN THE WAITING ROOM ONLY DURING PRE OP AND PROCEDURE DAY OF SURGERY. THE 1 VISITOR MAY VISIT WITH YOU AFTER SURGERY IN YOUR PRIVATE ROOM DURING VISITING HOURS ONLY!                Kandace Blitz    Your procedure is scheduled on: 05/26/19   Report to [facility] Main  Entrance   Report to admitting at: 8:15 AM     Call this number if you have problems the morning of surgery [phone]    Remember:   [place], NO [place].     Take these medicines the morning of surgery with A SIP OF WATER: omeprazole,paroxetine.use inhalers as needed.              You may not have any metal on your body including hair pins and              piercings  Do not wear jewelry, make-up, lotions, powders or perfumes, deodorant             Do not wear nail polish on your fingernails.  Do not shave  48 hours prior to surgery.                 Do not bring valuables to the hospital. [place].  Contacts, dentures or bridgework may not be worn into surgery.  Leave suitcase in the car. After surgery it may be brought to your room.     Patients discharged the day of surgery will not be allowed to drive home. IF YOU ARE HAVING SURGERY AND GOING HOME THE SAME DAY, YOU MUST HAVE AN ADULT TO DRIVE YOU HOME AND BE WITH YOU FOR 24 HOURS. YOU MAY GO HOME BY TAXI OR UBER OR ORTHERWISE, BUT AN ADULT MUST ACCOMPANY YOU HOME AND STAY WITH YOU FOR 24 HOURS.  Name and phone number of your driver:  Special Instructions: N/A              Please read over the following fact sheets you were given: _____________________________________________________________________             NO SOLID FOOD AFTER MIDNIGHT THE NIGHT PRIOR TO SURGERY. NOTHING BY MOUTH EXCEPT CLEAR LIQUIDS UNTIL: 7:45 am . PLEASE FINISH ENSURE DRINK PER  SURGEON ORDER  WHICH NEEDS TO BE COMPLETED AT: 7:45 am .   CLEAR LIQUID DIET   Foods Allowed                                                                     Foods Excluded  Coffee and tea, regular and decaf                             liquids that you cannot  Plain Jell-O any favor except red or purple  see through such as: Fruit ices (not with fruit pulp)                                     milk, soups, orange juice  Iced Popsicles                                    All solid food Carbonated beverages, regular and diet                                    Cranberry, grape and apple juices Sports drinks like Gatorade Lightly seasoned clear broth or consume(fat free) Sugar, honey syrup  Sample Menu Breakfast                                Lunch                                     Supper Cranberry juice                    Beef broth                            Chicken broth Jell-O                                     Grape juice                           Apple juice Coffee or tea                        Jell-O                                      Popsicle                                                Coffee or tea                        Coffee or tea  _____________________________________________________________________  [facility] - Preparing for Surgery Before surgery, you can play an important role.  Because skin is not sterile, your skin needs to be as free of germs as possible.  You can reduce the number of germs on your skin by washing with CHG (chlorahexidine gluconate) soap before surgery.  CHG is an antiseptic cleaner which kills germs and bonds with the skin to continue killing germs even after washing. Please DO NOT use if you have an allergy to CHG or antibacterial soaps.  If your skin becomes reddened/irritated stop using the CHG and inform your nurse when you arrive at Short Stay. Do not shave (including legs and underarms) for  at  least 48 hours prior to the first CHG shower.  You may shave your face/neck. Please follow these instructions carefully:  1.  Shower with CHG Soap the night before surgery and the  morning of Surgery.  2.  If you choose to wash your hair, wash your hair first as usual with your  normal  shampoo.  3.  After you shampoo, rinse your hair and body thoroughly to remove the  shampoo.                           4.  Use CHG as you would any other liquid soap.  You can apply chg directly  to the skin and wash                       Gently with a scrungie or clean washcloth.  5.  Apply the CHG Soap to your body ONLY FROM THE NECK DOWN.   Do not use on face/ open                           Wound or open sores. Avoid contact with eyes, ears mouth and genitals (private parts).                       Wash face,  Genitals (private parts) with your normal soap.             6.  Wash thoroughly, paying special attention to the area where your surgery  will be performed.  7.  Thoroughly rinse your body with warm water from the neck down.  8.  DO NOT shower/wash with your normal soap after using and rinsing off  the CHG Soap.                9.  Pat yourself dry with a clean towel.            10.  Wear clean pajamas.            11.  Place clean sheets on your bed the night of your first shower and do not  sleep with pets. Day of Surgery : Do not apply any lotions/deodorants the morning of surgery.  Please wear clean clothes to the hospital/surgery center.  FAILURE TO FOLLOW THESE INSTRUCTIONS MAY RESULT IN THE CANCELLATION OF YOUR SURGERY PATIENT SIGNATURE_________________________________  NURSE SIGNATURE__________________________________  ________________________________________________________________________   Adam Phenix  An incentive spirometer is a tool that can help keep your lungs clear and active. This tool measures how well you are filling your lungs with each breath. Taking long deep breaths  may help reverse or decrease the chance of developing breathing (pulmonary) problems (especially infection) following:  A long period of time when you are unable to move or be active. BEFORE THE PROCEDURE   If the spirometer includes an indicator to show your best effort, your nurse or respiratory therapist will set it to a desired goal.  If possible, sit up straight or lean slightly forward. Try not to slouch.  Hold the incentive spirometer in an upright position. INSTRUCTIONS FOR USE  1. Sit on the edge of your bed if possible, or sit up as far as you can in bed or on a chair. 2. Hold the incentive spirometer in an upright position. 3. Breathe out normally. 4. Place the mouthpiece in your mouth and seal your lips tightly  around it. 5. Breathe in slowly and as deeply as possible, raising the piston or the ball toward the top of the column. 6. Hold your breath for 3-5 seconds or for as long as possible. Allow the piston or ball to fall to the bottom of the column. 7. Remove the mouthpiece from your mouth and breathe out normally. 8. Rest for a few seconds and repeat Steps 1 through 7 at least 10 times every 1-2 hours when you are awake. Take your time and take a few normal breaths between deep breaths. 9. The spirometer may include an indicator to show your best effort. Use the indicator as a goal to work toward during each repetition. 10. After each set of 10 deep breaths, practice coughing to be sure your lungs are clear. If you have an incision (the cut made at the time of surgery), support your incision when coughing by placing a pillow or rolled up towels firmly against it. Once you are able to get out of bed, walk around indoors and cough well. You may stop using the incentive spirometer when instructed by your caregiver.  RISKS AND COMPLICATIONS  Take your time so you do not get dizzy or light-headed.  If you are in pain, you may need to take or ask for pain medication before doing  incentive spirometry. It is harder to take a deep breath if you are having pain. AFTER USE  Rest and breathe slowly and easily.  It can be helpful to keep track of a log of your progress. Your caregiver can provide you with a simple table to help with this. If you are using the spirometer at home, follow these instructions: [place] IF:   You are having difficultly using the spirometer.  You have trouble using the spirometer as often as instructed.  Your pain medication is not giving enough relief while using the spirometer.  You develop fever of 100.5 F (38.1 C) or higher. SEEK IMMEDIATE MEDICAL CARE IF:   You cough up bloody sputum that had not been present before.  You develop fever of 102 F (38.9 C) or greater.  You develop worsening pain at or near the incision site. MAKE SURE YOU:   Understand these instructions.  Will watch your condition.  Will get help right away if you are not doing well or get worse. Document Released: 07/01/2006 Document Revised: 05/13/2011 Document Reviewed: 09/01/2006 [facility] Patient Information 2014 [place], [place].   ________________________________________________________________________

## 2019-05-18 ENCOUNTER — Other Ambulatory Visit: Payer: Self-pay

## 2019-05-18 ENCOUNTER — Encounter (HOSPITAL_COMMUNITY): Payer: Self-pay

## 2019-05-18 ENCOUNTER — Other Ambulatory Visit: Payer: Self-pay | Admitting: Internal Medicine

## 2019-05-18 ENCOUNTER — Encounter (HOSPITAL_COMMUNITY)
Admission: RE | Admit: 2019-05-18 | Discharge: 2019-05-18 | Disposition: A | Discharge status: Home / Self Care | Payer: Medicare Other | Source: Ambulatory Visit | Attending: Orthopedic Surgery | Admitting: Orthopedic Surgery

## 2019-05-18 DIAGNOSIS — Z01812 Encounter for preprocedural laboratory examination: Secondary | ICD-10-CM | POA: Insufficient documentation

## 2019-05-18 DIAGNOSIS — Z8616 Personal history of COVID-19: Secondary | ICD-10-CM | POA: Diagnosis not present

## 2019-05-18 DIAGNOSIS — Z853 Personal history of malignant neoplasm of breast: Secondary | ICD-10-CM

## 2019-05-18 NOTE — Progress Notes (Signed)
PCP -  Cardiologist - Dr. Harriet Masson K.:clearance: 03/18/19. EPIC  Chest x-ray -  EKG - 03/18/19. EPIC Stress Test -  ECHO -  Cardiac Cath -  Myocardial perfusion: 03/18/19. EPIC Sleep Study -  CPAP -   Fasting Blood Sugar -  Checks Blood Sugar _____ times a day  Blood Thinner Instructions:Aspirin 81 mg. Will be stop a week before surgery. Aspirin Instructions: Last Dose:  Anesthesia review:   Patient denies shortness of breath, fever, cough and chest pain at PAT appointment   Patient verbalized understanding of instructions that were given to them at the PAT appointment. Patient was also instructed that they will need to review over the PAT instructions again at home before surgery.

## 2019-05-18 NOTE — Progress Notes (Signed)
As per pt.,she tested positive for COVID at the end of January.Report was requested by RN from [facility] on [address].

## 2019-05-18 NOTE — Progress Notes (Signed)
COVID test results received,were place in the chart.

## 2019-05-19 ENCOUNTER — Encounter (HOSPITAL_COMMUNITY)
Admission: RE | Admit: 2019-05-19 | Discharge: 2019-05-19 | Disposition: A | Discharge status: Home / Self Care | Payer: Medicare Other | Source: Ambulatory Visit | Attending: Orthopedic Surgery | Admitting: Orthopedic Surgery

## 2019-05-19 DIAGNOSIS — Z01812 Encounter for preprocedural laboratory examination: Secondary | ICD-10-CM | POA: Diagnosis not present

## 2019-05-19 LAB — SURGICAL PCR SCREEN
MRSA, PCR: NEGATIVE
Staphylococcus aureus: POSITIVE — AB

## 2019-05-19 LAB — PROTIME-INR
INR: 1 (ref 0.8–1.2)
Prothrombin Time: 13.4 seconds (ref 11.4–15.2)

## 2019-05-19 LAB — APTT: aPTT: 26 seconds (ref 24–36)

## 2019-05-19 LAB — ABO/RH: ABO/RH(D): A POS

## 2019-05-20 NOTE — Progress Notes (Signed)
PCR positive results for [facility].

## 2019-05-26 ENCOUNTER — Encounter (HOSPITAL_COMMUNITY)
Admission: RE | Disposition: A | Discharge status: Home / Self Care | Payer: Self-pay | Source: Home / Self Care | Attending: Orthopedic Surgery

## 2019-05-26 ENCOUNTER — Ambulatory Visit (HOSPITAL_COMMUNITY): Payer: Medicare Other

## 2019-05-26 ENCOUNTER — Ambulatory Visit (HOSPITAL_COMMUNITY): Payer: Medicare Other | Admitting: Certified Registered Nurse Anesthetist

## 2019-05-26 ENCOUNTER — Ambulatory Visit (HOSPITAL_COMMUNITY)
Admission: RE | Admit: 2019-05-26 | Discharge: 2019-05-26 | Disposition: A | Discharge status: Home / Self Care | Payer: Medicare Other | Attending: Orthopedic Surgery | Admitting: Orthopedic Surgery

## 2019-05-26 ENCOUNTER — Encounter (HOSPITAL_COMMUNITY): Payer: Self-pay | Admitting: Orthopedic Surgery

## 2019-05-26 DIAGNOSIS — Z881 Allergy status to other antibiotic agents status: Secondary | ICD-10-CM | POA: Insufficient documentation

## 2019-05-26 DIAGNOSIS — M169 Osteoarthritis of hip, unspecified: Secondary | ICD-10-CM | POA: Diagnosis present

## 2019-05-26 DIAGNOSIS — M1612 Unilateral primary osteoarthritis, left hip: Secondary | ICD-10-CM | POA: Insufficient documentation

## 2019-05-26 DIAGNOSIS — R7303 Prediabetes: Secondary | ICD-10-CM | POA: Diagnosis not present

## 2019-05-26 DIAGNOSIS — Z7951 Long term (current) use of inhaled steroids: Secondary | ICD-10-CM | POA: Insufficient documentation

## 2019-05-26 DIAGNOSIS — K219 Gastro-esophageal reflux disease without esophagitis: Secondary | ICD-10-CM | POA: Diagnosis not present

## 2019-05-26 DIAGNOSIS — Z7982 Long term (current) use of aspirin: Secondary | ICD-10-CM | POA: Diagnosis not present

## 2019-05-26 DIAGNOSIS — Z888 Allergy status to other drugs, medicaments and biological substances status: Secondary | ICD-10-CM | POA: Insufficient documentation

## 2019-05-26 DIAGNOSIS — Z87891 Personal history of nicotine dependence: Secondary | ICD-10-CM | POA: Diagnosis not present

## 2019-05-26 DIAGNOSIS — Z79899 Other long term (current) drug therapy: Secondary | ICD-10-CM | POA: Insufficient documentation

## 2019-05-26 DIAGNOSIS — Z96649 Presence of unspecified artificial hip joint: Secondary | ICD-10-CM

## 2019-05-26 DIAGNOSIS — Z419 Encounter for procedure for purposes other than remedying health state, unspecified: Secondary | ICD-10-CM

## 2019-05-26 HISTORY — PX: TOTAL HIP ARTHROPLASTY: SHX124

## 2019-05-26 LAB — GLUCOSE, CAPILLARY: Glucose-Capillary: 194 mg/dL — ABNORMAL HIGH (ref 70–99)

## 2019-05-26 LAB — TYPE AND SCREEN
ABO/RH(D): A POS
Antibody Screen: NEGATIVE

## 2019-05-26 SURGERY — ARTHROPLASTY, HIP, TOTAL, ANTERIOR APPROACH
Anesthesia: Spinal | Site: Hip | Laterality: Left

## 2019-05-26 MED ORDER — MENTHOL 3 MG MT LOZG: 1.0000 | LOZENGE | OROMUCOSAL | Status: DC | PRN

## 2019-05-26 MED ORDER — PHENYLEPHRINE HCL-NACL 10-0.9 MG/250ML-% IV SOLN
INTRAVENOUS | Status: AC
  Filled 2019-05-26: qty 250

## 2019-05-26 MED ORDER — FENTANYL CITRATE (PF) 100 MCG/2ML IJ SOLN
INTRAMUSCULAR | Status: AC
  Filled 2019-05-26: qty 2

## 2019-05-26 MED ORDER — MIDAZOLAM HCL 5 MG/5ML IJ SOLN
INTRAMUSCULAR | Status: DC | PRN
  Administered 2019-05-26: 2 mg via INTRAVENOUS

## 2019-05-26 MED ORDER — ONDANSETRON HCL 4 MG/2ML IJ SOLN
INTRAMUSCULAR | Status: AC
  Filled 2019-05-26: qty 2

## 2019-05-26 MED ORDER — METHOCARBAMOL 500 MG PO TABS
ORAL_TABLET | ORAL | Status: AC
  Administered 2019-05-26: 500 mg via ORAL
  Filled 2019-05-26: qty 1

## 2019-05-26 MED ORDER — FENTANYL CITRATE (PF) 100 MCG/2ML IJ SOLN: 25.0000 ug | INTRAMUSCULAR | Status: DC | PRN

## 2019-05-26 MED ORDER — PHENYLEPHRINE HCL-NACL 10-0.9 MG/250ML-% IV SOLN
INTRAVENOUS | Status: DC | PRN
  Administered 2019-05-26: 20 ug/min via INTRAVENOUS

## 2019-05-26 MED ORDER — HYDROCODONE-ACETAMINOPHEN 5-325 MG PO TABS: 1.0000 | ORAL_TABLET | Freq: Four times a day (QID) | ORAL | 0 refills | Status: DC | PRN

## 2019-05-26 MED ORDER — METOCLOPRAMIDE HCL 5 MG PO TABS: 5.0000 mg | ORAL_TABLET | Freq: Three times a day (TID) | ORAL | Status: DC | PRN

## 2019-05-26 MED ORDER — METHOCARBAMOL 500 MG PO TABS: 500.0000 mg | ORAL_TABLET | Freq: Four times a day (QID) | ORAL | 0 refills | Status: DC | PRN

## 2019-05-26 MED ORDER — MEPIVACAINE HCL (PF) 2 % IJ SOLN
INTRAMUSCULAR | Status: AC
  Filled 2019-05-26: qty 20

## 2019-05-26 MED ORDER — DEXAMETHASONE SODIUM PHOSPHATE 10 MG/ML IJ SOLN: 10.0000 mg | Freq: Once | INTRAMUSCULAR | Status: DC

## 2019-05-26 MED ORDER — PHENYLEPHRINE 40 MCG/ML (10ML) SYRINGE FOR IV PUSH (FOR BLOOD PRESSURE SUPPORT)
PREFILLED_SYRINGE | INTRAVENOUS | Status: AC
  Filled 2019-05-26: qty 10

## 2019-05-26 MED ORDER — CEFAZOLIN SODIUM-DEXTROSE 2-4 GM/100ML-% IV SOLN
INTRAVENOUS | Status: AC
  Administered 2019-05-26: 2 g via INTRAVENOUS
  Filled 2019-05-26: qty 100

## 2019-05-26 MED ORDER — WATER FOR IRRIGATION, STERILE IR SOLN
Status: DC | PRN
  Administered 2019-05-26: 2000 mL

## 2019-05-26 MED ORDER — CEFAZOLIN SODIUM-DEXTROSE 2-4 GM/100ML-% IV SOLN: 2.0000 g | Freq: Four times a day (QID) | INTRAVENOUS | Status: DC

## 2019-05-26 MED ORDER — POVIDONE-IODINE 10 % EX SWAB
2.0000 "application " | Freq: Once | CUTANEOUS | Status: AC
  Administered 2019-05-26: 2 via TOPICAL

## 2019-05-26 MED ORDER — SODIUM CHLORIDE 0.9 % IV SOLN: INTRAVENOUS | Status: DC

## 2019-05-26 MED ORDER — DEXAMETHASONE SODIUM PHOSPHATE 10 MG/ML IJ SOLN
8.0000 mg | Freq: Once | INTRAMUSCULAR | Status: AC
  Administered 2019-05-26: 8 mg via INTRAVENOUS

## 2019-05-26 MED ORDER — LACTATED RINGERS IV BOLUS
500.0000 mL | Freq: Once | INTRAVENOUS | Status: AC
  Administered 2019-05-26: 500 mL via INTRAVENOUS

## 2019-05-26 MED ORDER — HYDROCODONE-ACETAMINOPHEN 5-325 MG PO TABS
ORAL_TABLET | ORAL | Status: AC
  Administered 2019-05-26: 1 via ORAL
  Filled 2019-05-26: qty 1

## 2019-05-26 MED ORDER — MIDAZOLAM HCL 2 MG/2ML IJ SOLN
INTRAMUSCULAR | Status: AC
  Filled 2019-05-26: qty 2

## 2019-05-26 MED ORDER — PROPOFOL 500 MG/50ML IV EMUL
INTRAVENOUS | Status: DC | PRN
  Administered 2019-05-26: 75 ug/kg/min via INTRAVENOUS

## 2019-05-26 MED ORDER — HYDROCODONE-ACETAMINOPHEN 7.5-325 MG PO TABS: 1.0000 | ORAL_TABLET | ORAL | Status: DC | PRN

## 2019-05-26 MED ORDER — CEFAZOLIN SODIUM-DEXTROSE 2-4 GM/100ML-% IV SOLN
2.0000 g | INTRAVENOUS | Status: AC
  Administered 2019-05-26: 2 g via INTRAVENOUS
  Filled 2019-05-26: qty 100

## 2019-05-26 MED ORDER — BISACODYL 10 MG RE SUPP: 10.0000 mg | Freq: Every day | RECTAL | Status: DC | PRN

## 2019-05-26 MED ORDER — PHENOL 1.4 % MT LIQD: 1.0000 | OROMUCOSAL | Status: DC | PRN

## 2019-05-26 MED ORDER — DOCUSATE SODIUM 100 MG PO CAPS: 100.0000 mg | ORAL_CAPSULE | Freq: Two times a day (BID) | ORAL | Status: DC

## 2019-05-26 MED ORDER — LACTATED RINGERS IV BOLUS
250.0000 mL | Freq: Once | INTRAVENOUS | Status: AC
  Administered 2019-05-26: 250 mL via INTRAVENOUS

## 2019-05-26 MED ORDER — METHOCARBAMOL 500 MG IVPB - SIMPLE MED: 500.0000 mg | Freq: Four times a day (QID) | INTRAVENOUS | Status: DC | PRN

## 2019-05-26 MED ORDER — METHOCARBAMOL 500 MG PO TABS: 500.0000 mg | ORAL_TABLET | Freq: Four times a day (QID) | ORAL | Status: DC | PRN

## 2019-05-26 MED ORDER — ACETAMINOPHEN 325 MG PO TABS: 325.0000 mg | ORAL_TABLET | Freq: Four times a day (QID) | ORAL | Status: DC | PRN

## 2019-05-26 MED ORDER — PROPOFOL 1000 MG/100ML IV EMUL
INTRAVENOUS | Status: AC
  Filled 2019-05-26: qty 100

## 2019-05-26 MED ORDER — 0.9 % SODIUM CHLORIDE (POUR BTL) OPTIME
TOPICAL | Status: DC | PRN
  Administered 2019-05-26: 1000 mL

## 2019-05-26 MED ORDER — ACETAMINOPHEN 10 MG/ML IV SOLN
1000.0000 mg | Freq: Four times a day (QID) | INTRAVENOUS | Status: DC
  Administered 2019-05-26: 1000 mg via INTRAVENOUS
  Filled 2019-05-26: qty 100

## 2019-05-26 MED ORDER — POLYETHYLENE GLYCOL 3350 17 G PO PACK: 17.0000 g | PACK | Freq: Every day | ORAL | Status: DC | PRN

## 2019-05-26 MED ORDER — MORPHINE SULFATE (PF) 4 MG/ML IV SOLN: 0.5000 mg | INTRAVENOUS | Status: DC | PRN

## 2019-05-26 MED ORDER — MAGNESIUM CITRATE PO SOLN: 1.0000 | Freq: Once | ORAL | Status: DC | PRN

## 2019-05-26 MED ORDER — RIVAROXABAN 10 MG PO TABS: 10.0000 mg | ORAL_TABLET | Freq: Every day | ORAL | 0 refills | Status: DC

## 2019-05-26 MED ORDER — CHLORHEXIDINE GLUCONATE 4 % EX LIQD: 60.0000 mL | Freq: Once | CUTANEOUS | Status: DC

## 2019-05-26 MED ORDER — FENTANYL CITRATE (PF) 100 MCG/2ML IJ SOLN
INTRAMUSCULAR | Status: DC | PRN
  Administered 2019-05-26 (×2): 50 ug via INTRAVENOUS

## 2019-05-26 MED ORDER — METOCLOPRAMIDE HCL 5 MG/ML IJ SOLN: 5.0000 mg | Freq: Three times a day (TID) | INTRAMUSCULAR | Status: DC | PRN

## 2019-05-26 MED ORDER — ONDANSETRON HCL 4 MG/2ML IJ SOLN: 4.0000 mg | Freq: Four times a day (QID) | INTRAMUSCULAR | Status: DC | PRN

## 2019-05-26 MED ORDER — DEXAMETHASONE SODIUM PHOSPHATE 10 MG/ML IJ SOLN
INTRAMUSCULAR | Status: AC
  Filled 2019-05-26: qty 1

## 2019-05-26 MED ORDER — LACTATED RINGERS IV SOLN: INTRAVENOUS | Status: DC

## 2019-05-26 MED ORDER — ONDANSETRON HCL 4 MG/2ML IJ SOLN
INTRAMUSCULAR | Status: DC | PRN
  Administered 2019-05-26: 4 mg via INTRAVENOUS

## 2019-05-26 MED ORDER — BUPIVACAINE HCL 0.25 % IJ SOLN
INTRAMUSCULAR | Status: DC | PRN
  Administered 2019-05-26: 30 mL

## 2019-05-26 MED ORDER — TRANEXAMIC ACID-NACL 1000-0.7 MG/100ML-% IV SOLN
1000.0000 mg | INTRAVENOUS | Status: AC
  Administered 2019-05-26: 1000 mg via INTRAVENOUS
  Filled 2019-05-26: qty 100

## 2019-05-26 MED ORDER — ONDANSETRON HCL 4 MG PO TABS: 4.0000 mg | ORAL_TABLET | Freq: Four times a day (QID) | ORAL | Status: DC | PRN

## 2019-05-26 MED ORDER — MEPIVACAINE HCL (PF) 2 % IJ SOLN
INTRAMUSCULAR | Status: DC | PRN
  Administered 2019-05-26: 60 mg via EPIDURAL

## 2019-05-26 MED ORDER — HYDROCODONE-ACETAMINOPHEN 5-325 MG PO TABS: 1.0000 | ORAL_TABLET | ORAL | Status: DC | PRN

## 2019-05-26 MED ORDER — BUPIVACAINE HCL 0.25 % IJ SOLN
INTRAMUSCULAR | Status: AC
  Filled 2019-05-26: qty 1

## 2019-05-26 SURGICAL SUPPLY — 47 items
BAG DECANTER FOR FLEXI CONT (MISCELLANEOUS) IMPLANT
BAG ZIPLOCK 12X15 (MISCELLANEOUS) IMPLANT
BALL HIP CERAMIC (Hips) IMPLANT
BLADE SAG 18X100X1.27 (BLADE) ×3 IMPLANT
CLOSURE WOUND 1/2 X4 (GAUZE/BANDAGES/DRESSINGS) ×2
COVER PERINEAL POST (MISCELLANEOUS) ×3 IMPLANT
COVER SURGICAL LIGHT HANDLE (MISCELLANEOUS) ×3 IMPLANT
COVER WAND RF STERILE (DRAPES) IMPLANT
CUP ACETBLR 52 OD PINNACLE (Hips) ×2 IMPLANT
DECANTER SPIKE VIAL GLASS SM (MISCELLANEOUS) ×3 IMPLANT
DRAPE STERI IOBAN 125X83 (DRAPES) ×3 IMPLANT
DRAPE U-SHAPE 47X51 STRL (DRAPES) ×6 IMPLANT
DRSG ADAPTIC 3X8 NADH LF (GAUZE/BANDAGES/DRESSINGS) ×3 IMPLANT
DRSG AQUACEL AG ADV 3.5X10 (GAUZE/BANDAGES/DRESSINGS) ×3 IMPLANT
DURAPREP 26ML APPLICATOR (WOUND CARE) ×3 IMPLANT
ELECT REM PT RETURN 15FT ADLT (MISCELLANEOUS) ×3 IMPLANT
EVACUATOR 1/8 PVC DRAIN (DRAIN) IMPLANT
GLOVE BIO SURGEON STRL SZ 6 (GLOVE) IMPLANT
GLOVE BIO SURGEON STRL SZ7 (GLOVE) IMPLANT
GLOVE BIO SURGEON STRL SZ8 (GLOVE) ×3 IMPLANT
GLOVE BIOGEL PI IND STRL 6.5 (GLOVE) IMPLANT
GLOVE BIOGEL PI IND STRL 7.0 (GLOVE) IMPLANT
GLOVE BIOGEL PI IND STRL 8 (GLOVE) ×1 IMPLANT
GLOVE BIOGEL PI INDICATOR 6.5 (GLOVE)
GLOVE BIOGEL PI INDICATOR 7.0 (GLOVE) ×4
GLOVE BIOGEL PI INDICATOR 8 (GLOVE) ×6
GOWN STRL REUS W/TWL LRG LVL3 (GOWN DISPOSABLE) ×3 IMPLANT
GOWN STRL REUS W/TWL XL LVL3 (GOWN DISPOSABLE) IMPLANT
HIP BALL CERAMIC (Hips) ×3 IMPLANT
HOLDER FOLEY CATH W/STRAP (MISCELLANEOUS) ×1 IMPLANT
KIT TURNOVER KIT A (KITS) IMPLANT
LINER MARATHON NEUT +4X52X32 (Hips) ×2 IMPLANT
MANIFOLD NEPTUNE II (INSTRUMENTS) ×3 IMPLANT
PACK ANTERIOR HIP CUSTOM (KITS) ×3 IMPLANT
PENCIL SMOKE EVACUATOR COATED (MISCELLANEOUS) ×3 IMPLANT
STEM FEMORAL SZ 5MM STD ACTIS (Stem) ×2 IMPLANT
STRIP CLOSURE SKIN 1/2X4 (GAUZE/BANDAGES/DRESSINGS) ×3 IMPLANT
SUT ETHIBOND NAB CT1 #1 30IN (SUTURE) ×3 IMPLANT
SUT MNCRL AB 4-0 PS2 18 (SUTURE) ×3 IMPLANT
SUT STRATAFIX 0 PDS 27 VIOLET (SUTURE) ×3
SUT VIC AB 2-0 CT1 27 (SUTURE) ×6
SUT VIC AB 2-0 CT1 TAPERPNT 27 (SUTURE) ×2 IMPLANT
SUTURE STRATFX 0 PDS 27 VIOLET (SUTURE) ×1 IMPLANT
SYR 50ML LL SCALE MARK (SYRINGE) IMPLANT
TRAY CATH 16FR W/PLASTIC CATH (SET/KITS/TRAYS/PACK) ×2 IMPLANT
TRAY FOLEY MTR SLVR 16FR STAT (SET/KITS/TRAYS/PACK) ×1 IMPLANT
YANKAUER SUCT BULB TIP 10FT TU (MISCELLANEOUS) ×3 IMPLANT

## 2019-05-26 NOTE — Transfer of Care (Signed)
Immediate Anesthesia Transfer of Care Note  Patient: Melinda Wolfe  Procedure(s) Performed: TOTAL HIP ARTHROPLASTY ANTERIOR APPROACH (Left Hip)  Patient Location: PACU  Anesthesia Type:General  Level of Consciousness: awake, alert  and oriented  Airway & Oxygen Therapy: Patient Spontanous Breathing and Patient connected to face mask oxygen  Post-op Assessment: Report given to RN and Post -op Vital signs reviewed and stable  Post vital signs: Reviewed and stable  Last Vitals:  Vitals Value Taken Time  BP    Temp    Pulse 83 05/26/19 1153  Resp 12 05/26/19 1153  SpO2 100 % 05/26/19 1153  Vitals shown include unvalidated device data.  Last Pain:  Vitals:   05/26/19 0848  TempSrc:   PainSc: 0-No pain         Complications: No apparent anesthesia complications

## 2019-05-26 NOTE — Progress Notes (Signed)
Physical Therapy Treatment Patient Details Name: Melinda Wolfe MRN: [id number] DOB: [birth year] Today's Date: 05/26/2019    History of Present Illness Patient is 69 y.o. female s/p Lt THA anterior approach on 05/26/19 with PMH significant for depression, anxiety, asthma, breast cancer, GERD, OA.     PT Comments    Patient seen for additional session for car transfer training. Pt performed transfer safely with supervision/min guard to steady walker as she performed reverse step up to transfer into car. Pt and her husband educated on need to use lower car for all further appointments and verbalized understanding. Pt discharged with husband and friend to assist.    Follow Up Recommendations  Follow surgeon's recommendation for DC plan and follow-up therapies     Equipment Recommendations  Rolling walker with 5" wheels(delievered in PACU)    Recommendations for Other Services       Precautions / Restrictions Precautions Precautions: Fall Restrictions Weight Bearing Restrictions: No    Mobility   Transfers Overall transfer level: Needs assistance Equipment used: Rolling walker (2 wheeled) Transfers: Sit to/from Stand Sit to Stand: Supervision         General transfer comment: pt with good carryover from prior session with safe hand placement and technique. pt able to transfer from wheelchair to stand and walk to car. Pt performed reverse step up to get into car as it was high, pt was able to perform with supervision/min guard to steady RW. Pt's husband educated on guarding.  Ambulation/Gait Ambulation/Gait assistance: Min guard;Supervision Gait Distance (Feet): 8 Feet Assistive device: Rolling walker (2 wheeled) Gait Pattern/deviations: Step-to pattern;Decreased stride length;Decreased weight shift to left Gait velocity: decreased   General Gait Details: pt with good carryover for safe hand placement and maintaining safe proximity to RW throughout short gait to walk  to car.     Wheelchair Mobility    Modified Rankin (Stroke Patients Only)       Balance Overall balance assessment: Needs assistance Sitting-balance support: Feet supported Sitting balance-Leahy Scale: Good     Standing balance support: During functional activity;Bilateral upper extremity supported Standing balance-Leahy Scale: Fair         Cognition Arousal/Alertness: Awake/alert Behavior During Therapy: WFL for tasks assessed/performed Overall Cognitive Status: Within Functional Limits for tasks assessed            General Comments        Pertinent Vitals/Pain Pain Assessment: Faces Pain Score: 4  Faces Pain Scale: Hurts a little bit Pain Location: Lt hip Pain Descriptors / Indicators: Burning Pain Intervention(s): Limited activity within patient's tolerance;Monitored during session;Ice applied    Home Living Family/patient expects to be discharged to:: Private residence Living Arrangements: Spouse/significant other Available Help at Discharge: Family Type of Home: House Home Access: Stairs to enter Entrance Stairs-Rails: None Home Layout: One level Home Equipment: Environmental consultant - 2 wheels;Cane - single point      Prior Function Level of Independence: Independent          PT Goals (current goals can now be found in the care plan section) Acute Rehab PT Goals Patient Stated Goal: to get home today PT Goal Formulation: With patient Time For Goal Achievement: 06/02/19 Potential to Achieve Goals: Good    Frequency    7X/week      PT Plan Current plan remains appropriate       AM-PAC PT "6 Clicks" Mobility   Outcome Measure  Help needed turning from your back to your side while in a flat  bed without using bedrails?: None Help needed moving from lying on your back to sitting on the side of a flat bed without using bedrails?: None Help needed moving to and from a bed to a chair (including a wheelchair)?: A Little Help needed standing up from a  chair using your arms (e.g., wheelchair or bedside chair)?: A Little Help needed to walk in hospital room?: A Little Help needed climbing 3-5 steps with a railing? : A Little 6 Click Score: 20    End of Session Equipment Utilized During Treatment: Gait belt Activity Tolerance: Patient tolerated treatment well Patient left: with family/visitor present(transfered into car with husband and friend.) Nurse Communication: Mobility status PT Visit Diagnosis: Muscle weakness (generalized) (M62.81);Difficulty in walking, not elsewhere classified (R26.2)     Time: [id number] PT Time Calculation (min) (ACUTE ONLY): 9 min  Charges:   $Therapeutic Activity: 8-22 mins                     Verner Mould, DPT Physical Therapist with [facility] [phone]  05/26/2019 5:57 PM

## 2019-05-26 NOTE — Anesthesia Preprocedure Evaluation (Addendum)
Anesthesia Evaluation  Patient identified by MRN, date of birth, ID band Patient awake    Airway Mallampati: II  TM Distance: >3 FB     Dental   Pulmonary former smoker,    breath sounds clear to auscultation       Cardiovascular negative cardio ROS   Rhythm:Regular Rate:Normal     Neuro/Psych    GI/Hepatic Neg liver ROS, GERD  ,  Endo/Other  negative endocrine ROS  Renal/GU negative Renal ROS     Musculoskeletal   Abdominal   Peds  Hematology   Anesthesia Other Findings   Reproductive/Obstetrics                             Anesthesia Physical Anesthesia Plan  ASA: III  Anesthesia Plan: Spinal   Post-op Pain Management:    Induction: Intravenous  PONV Risk Score and Plan:   Airway Management Planned: Nasal Cannula and Simple Face Mask  Additional Equipment:   Intra-op Plan:   Post-operative Plan:   Informed Consent: I have reviewed the patients History and Physical, chart, labs and discussed the procedure including the risks, benefits and alternatives for the proposed anesthesia with the patient or authorized representative who has indicated his/her understanding and acceptance.     Dental advisory given  Plan Discussed with:   Anesthesia Plan Comments:        Anesthesia Quick Evaluation

## 2019-05-26 NOTE — Discharge Instructions (Addendum)
Melinda Arabian, MD [facility] Specialist [facility] [address], [address] [place], [place] [address] [phone]       ANTERIOR APPROACH TOTAL HIP REPLACEMENT POSTOPERATIVE DIRECTIONS FOR SAME DAY DISCHARGE    Hip Rehabilitation, Guidelines Following Surgery  The results of a hip operation are greatly improved after range of motion and muscle strengthening exercises. Follow all safety measures which are given to protect your hip. If any of these exercises cause increased pain or swelling in your joint, decrease the amount until you are comfortable again. Then slowly increase the exercises. Call your caregiver if you have problems or questions.   BLOOD CLOT PREVENTION . Take a 10 mg Xarelto once a day for three weeks following surgery. Then resume one 81 mg aspirin once a day. . You may resume your vitamins/supplements upon once you have discontinued the Xarelto. . Do not take any NSAIDs (Advil, Aleve, Ibuprofen, Meloxicam, etc.) until you have discontinued the Xarelto.   HOME CARE INSTRUCTIONS  . Remove items at home which could result in a fall. This includes throw rugs or furniture in walking pathways.   ICE to the affected hip as frequently as 20-30 minutes an hour and then as needed for pain and swelling.  Continue to use ice on the hip for pain and swelling from surgery. You may notice swelling that will progress down to the foot and ankle.  This is normal after surgery.  Elevate the leg when you are not up walking on it.    Continue to use the breathing machine which will help keep your temperature down.  It is common for your temperature to cycle up and down following surgery, especially at night when you are not up moving around and exerting yourself.  The breathing machine keeps your lungs expanded and your temperature down.  DIET You may resume your previous home diet once you are discharged from the hospital.  DRESSING / WOUND CARE /  SHOWERING . You have an adhesive waterproof bandage over the incision. Leave this in place until your first follow-up appointment. Once you remove this you will not need to place another bandage.  . You may begin showering 3 days following surgery, but do not submerge the incision under water.  ACTIVITY . For the first 3-5 days it is important to rest and keep the operative leg elevated. You should, as a general rule, rest for 50 minutes per hour and get up and walk/stretch for 10 minutes per hour. After 5 days you can slowly increase activity as tolerated. Marland Kitchen Perform the exercises you were provided twice a day for about 15-20 minutes each session. Begin these 2 days after surgery.  WEIGHT BEARING . You may bear weight as tolerated on your operative leg using a walker for assistance . Walk with your walker as instructed. Use the walker until you are comfortable transitioning to a cane. Walk with the cane in the opposite hand of the operative leg. You may discontinue the cane once you are comfortable and walking steadily.  POSTOPERATIVE CONSTIPATION PROTOCOL Constipation - defined medically as fewer than three stools per week and severe constipation as less than one stool per week.  One of the most common issues patients have following surgery is constipation.  Even if you have a regular bowel pattern at home, your normal regimen is likely to be disrupted due to multiple reasons following surgery.  Combination of anesthesia, postoperative narcotics, change in appetite and fluid intake all can  affect your bowels.  In order to avoid complications following surgery, here are some recommendations in order to help you during your recovery period.  Colace (docusate) - Pick up an over-the-counter form of Colace or another stool softener and take twice a day as long as you are requiring postoperative pain medications.  Take with a full glass of water daily.  If you experience loose stools or diarrhea, hold  the colace until you stool forms back up.  If your symptoms do not get better within 1 week or if they get worse, check with your doctor  MiraLax (polyethylene glycol) - Pick up over-the-counter to have on hand.  MiraLax is a solution that will increase the amount of water in your bowels to assist with bowel movements.  Take as directed and can mix with a glass of water, juice, soda, coffee, or tea.  Take if you go more than two days without a movement. Do not use MiraLax more than once per day. Call your doctor if you are still constipated or irregular after using this medication for 5 days in a row.  If you continue to have problems with postoperative constipation, please contact the office for further assistance and recommendations.  If you experience "the worst abdominal pain ever" or develop nausea or vomiting, please contact the office immediatly for further recommendations for treatment.  ITCHING  If you experience itching with your medications, try taking only a single pain pill, or even half a pain pill at a time.  You can also use Benadryl over the counter for itching or also to help with sleep.   TED HOSE STOCKINGS Wear the elastic stockings on both legs for three weeks following surgery during the day but you may remove then at night for sleeping.  MEDICATIONS See your medication summary on the "After Visit Summary" that the nursing staff will review with you prior to discharge.  You may have some home medications which will be placed on hold until you complete the course of blood thinner medication.  It is important for you to complete the blood thinner medication as prescribed by your surgeon.  Continue your approved medications as instructed at time of discharge.  PRECAUTIONS If you experience chest pain or shortness of breath - call 911 immediately for transfer to the hospital emergency department.  If you develop a fever greater that 101 F, purulent drainage from wound, increased  redness or drainage from wound, foul odor from the wound/dressing, or calf pain - CONTACT YOUR SURGEON.                                                   FOLLOW-UP APPOINTMENTS Make sure you keep all of your appointments after your operation with your surgeon and caregivers. You should call the office at the above phone number and make an appointment for approximately two weeks after the date of your surgery or on the date instructed by your surgeon outlined in the "After Visit Summary".  MAKE SURE YOU:  . Understand these instructions.  . Get help right away if you are not doing well or get worse.    Pick up stool softner and laxative for home use following surgery while on pain medications. May shower starting three days after surgery. Continue to use ice for pain and swelling after surgery. Do not use  any lotions or creams on the incision until instructed by your surgeon.

## 2019-05-26 NOTE — Evaluation (Signed)
Physical Therapy Evaluation Patient Details Name: Melinda Wolfe MRN: [id number] DOB: 1951/02/06 Today's Date: 05/26/2019   History of Present Illness  Patient is 69 y.o. female s/p Lt THA anterior approach on 05/26/19 with PMH significant for depression, anxiety, asthma, breast cancer, GERD, OA.     Clinical Impression  Melinda Wolfe is a 69 y.o. female POD 0 s/p Lt THA. Patient reports independence with mobility at baseline. Patient is now limited by functional impairments (see PT problem list below) and requires supervision for transfers and gait with RW. Patient was able to ambulate ~160 feet with RW and supervision and cues for safe walker management initially. Patient educated on safe sequencing for stair mobility and verbalized safe guarding position for people assisting with mobility. Patient instructed in exercises to facilitate ROM and circulation. Patient will benefit from continued skilled PT interventions to address impairments and progress towards PLOF. Acute PT will work with pt when she discharges from the hospital for car transfer training to ensure safe discharge home. Patient has met mobility goals at adequate level for discharge home; will continue to follow if pt continues acute stay to progress towards Mod I goals.     Follow Up Recommendations Follow surgeon's recommendation for DC plan and follow-up therapies    Equipment Recommendations  Rolling walker with 5" wheels(delievered in PACU)    Recommendations for Other Services       Precautions / Restrictions Precautions Precautions: Fall Restrictions Weight Bearing Restrictions: No      Mobility  Bed Mobility Overal bed mobility: Needs Assistance Bed Mobility: Supine to Sit;Sit to Supine     Supine to sit: Supervision Sit to supine: Supervision   General bed mobility comments: pt requires extra time to sit up and bring LE's to EOB.  Transfers Overall transfer level: Needs  assistance Equipment used: Rolling walker (2 wheeled) Transfers: Sit to/from Stand Sit to Stand: Supervision         General transfer comment: cues for safe hand placement and technique with RW, no overt LOB with rising, pt able to complete power up without assist. cues required for safe reach back. pt able to complete sit<>stand from toilet with use of grab bar and walker.   Ambulation/Gait Ambulation/Gait assistance: Min guard;Supervision Gait Distance (Feet): 160 Feet Assistive device: Rolling walker (2 wheeled) Gait Pattern/deviations: Step-to pattern;Decreased stride length;Decreased weight shift to left Gait velocity: decreased   General Gait Details: cues for safe proximity and step pattern with RW, no overt LOB throughout. pt progressed from min guard to supervsion with RW use.   Stairs Stairs: Yes Stairs assistance: Supervision Stair Management: No rails;Backwards;Step to pattern;With walker Number of Stairs: 2(2x1) General stair comments: cues for step sequencing "up with good, down with bad" and for safe positioning of RW and safe guarding for persons assisting her. Pt able to complete without cues on second bout.   Wheelchair Mobility    Modified Rankin (Stroke Patients Only)       Balance Overall balance assessment: Needs assistance Sitting-balance support: Feet supported Sitting balance-Leahy Scale: Good     Standing balance support: During functional activity;Bilateral upper extremity supported Standing balance-Leahy Scale: Fair          Pertinent Vitals/Pain Pain Assessment: 0-10 Pain Score: 4  Pain Location: Lt hip Pain Descriptors / Indicators: Burning Pain Intervention(s): Limited activity within patient's tolerance;Monitored during session;Repositioned    Home Living Family/patient expects to be discharged to:: Private residence Living Arrangements: Spouse/significant other Available Help at  Discharge: Family Type of Home: House Home  Access: Stairs to enter Entrance Stairs-Rails: None Entrance Stairs-Number of Steps: 1 Home Layout: One level Home Equipment: Environmental consultant - 2 wheels;Cane - single point      Prior Function Level of Independence: Independent               Hand Dominance   Dominant Hand: Right    Extremity/Trunk Assessment   Upper Extremity Assessment Upper Extremity Assessment: Overall WFL for tasks assessed    Lower Extremity Assessment Lower Extremity Assessment: Overall WFL for tasks assessed    Cervical / Trunk Assessment Cervical / Trunk Assessment: Normal  Communication   Communication: No difficulties  Cognition Arousal/Alertness: Awake/alert Behavior During Therapy: WFL for tasks assessed/performed Overall Cognitive Status: Within Functional Limits for tasks assessed         General Comments      Exercises Total Joint Exercises Ankle Circles/Pumps: AROM;Both;15 reps;Supine Quad Sets: AROM;Left;5 reps;Supine Short Arc Quad: AROM;Left;5 reps;Supine Heel Slides: AROM;Left;5 reps;Supine Hip ABduction/ADduction: AROM;Left;5 reps;Supine Long Arc Quad: AROM;Left;5 reps;Seated   Assessment/Plan    PT Assessment Patient needs continued PT services  PT Problem List Decreased strength;Decreased range of motion;Decreased activity tolerance;Decreased balance;Decreased mobility;Decreased knowledge of use of DME;Pain       PT Treatment Interventions DME instruction;Gait training;Stair training;Functional mobility training;Therapeutic activities;Therapeutic exercise;Balance training;Patient/family education    PT Goals (Current goals can be found in the Care Plan section)  Acute Rehab PT Goals Patient Stated Goal: to get home today PT Goal Formulation: With patient Time For Goal Achievement: 06/02/19 Potential to Achieve Goals: Good    Frequency 7X/week    AM-PAC PT "6 Clicks" Mobility  Outcome Measure Help needed turning from your back to your side while in a flat bed  without using bedrails?: None Help needed moving from lying on your back to sitting on the side of a flat bed without using bedrails?: None Help needed moving to and from a bed to a chair (including a wheelchair)?: A Little Help needed standing up from a chair using your arms (e.g., wheelchair or bedside chair)?: A Little Help needed to walk in hospital room?: A Little Help needed climbing 3-5 steps with a railing? : A Little 6 Click Score: 20    End of Session Equipment Utilized During Treatment: Gait belt Activity Tolerance: Patient tolerated treatment well Patient left: in bed;with call bell/phone within reach Nurse Communication: Mobility status PT Visit Diagnosis: Muscle weakness (generalized) (M62.81);Difficulty in walking, not elsewhere classified (R26.2)    Time: 1423-9532 PT Time Calculation (min) (ACUTE ONLY): 38 min   Charges:   PT Evaluation $PT Eval Low Complexity: 1 Low PT Treatments $Gait Training: 8-22 mins $Therapeutic Exercise: 8-22 mins       Verner Mould, DPT Physical Therapist with [facility] [phone]  05/26/2019 5:24 PM

## 2019-05-26 NOTE — Interval H&P Note (Signed)
History and Physical Interval Note:  05/26/2019 8:55 AM  Melinda Wolfe  has presented today for surgery, with the diagnosis of Left hip osteoarthritis.  The various methods of treatment have been discussed with the patient and family. After consideration of risks, benefits and other options for treatment, the patient has consented to  Procedure(s) with comments: [name] - 155min as a surgical intervention.  The patient's history has been reviewed, patient examined, no change in status, stable for surgery.  I have reviewed the patient's chart and labs.  Questions were answered to the patient's satisfaction.     Pilar Plate Juliane Guest

## 2019-05-26 NOTE — Op Note (Signed)
OPERATIVE REPORT- TOTAL HIP ARTHROPLASTY   PREOPERATIVE DIAGNOSIS: Osteoarthritis of the Left hip.   POSTOPERATIVE DIAGNOSIS: Osteoarthritis of the Left  hip.   PROCEDURE: Left total hip arthroplasty, anterior approach.   SURGEON: Gaynelle Arabian, MD   ASSISTANT: Theresa Duty, PA-C  ANESTHESIA:  Spinal  ESTIMATED BLOOD LOSS:-350 mL    DRAINS: Hemovac x1.   COMPLICATIONS: None   CONDITION: PACU - hemodynamically stable.   BRIEF CLINICAL NOTE: Melinda Wolfe is a 69 y.o. female who has advanced end-  stage arthritis of their Left  hip with progressively worsening pain and  dysfunction.The patient has failed nonoperative management and presents for  total hip arthroplasty.   PROCEDURE IN DETAIL: After successful administration of spinal  anesthetic, the traction boots for the [facility] bed were placed on both  feet and the patient was placed onto the [facility] bed, boots placed into the leg  holders. The Left hip was then isolated from the perineum with plastic  drapes and prepped and draped in the usual sterile fashion. ASIS and  greater trochanter were marked and a oblique incision was made, starting  at about 1 cm lateral and 2 cm distal to the ASIS and coursing towards  the anterior cortex of the femur. The skin was cut with a 10 blade  through subcutaneous tissue to the level of the fascia overlying the  tensor fascia lata muscle. The fascia was then incised in line with the  incision at the junction of the anterior third and posterior 2/3rd. The  muscle was teased off the fascia and then the interval between the TFL  and the rectus was developed. The Hohmann retractor was then placed at  the top of the femoral neck over the capsule. The vessels overlying the  capsule were cauterized and the fat on top of the capsule was removed.  A Hohmann retractor was then placed anterior underneath the rectus  femoris to give exposure to the entire anterior capsule. A  T-shaped  capsulotomy was performed. The edges were tagged and the femoral head  was identified.       Osteophytes are removed off the superior acetabulum.  The femoral neck was then cut in situ with an oscillating saw. Traction  was then applied to the left lower extremity utilizing the [facility]  traction. The femoral head was then removed. Retractors were placed  around the acetabulum and then circumferential removal of the labrum was  performed. Osteophytes were also removed. Reaming starts at 47 mm to  medialize and  Increased in 2 mm increments to 51 mm. We reamed in  approximately 40 degrees of abduction, 20 degrees anteversion. A 52 mm  pinnacle acetabular shell was then impacted in anatomic position under  fluoroscopic guidance with excellent purchase. We did not need to place  any additional dome screws. A 32 mm neutral + 4 marathon liner was then  placed into the acetabular shell.       The femoral lift was then placed along the lateral aspect of the femur  just distal to the vastus ridge. The leg was  externally rotated and capsule  was stripped off the inferior aspect of the femoral neck down to the  level of the lesser trochanter, this was done with electrocautery. The femur was lifted after this was performed. The  leg was then placed in an extended and adducted position essentially delivering the femur. We also removed the capsule superiorly and the piriformis from the  piriformis fossa to gain excellent exposure of the  proximal femur. Rongeur was used to remove some cancellous bone to get  into the lateral portion of the proximal femur for placement of the  initial starter reamer. The starter broaches was placed  the starter broach  and was shown to go down the center of the canal. Broaching  with the Actis system was then performed starting at size 0  coursing  Up to size 5. A size 5 had excellent torsional and rotational  and axial stability. The trial standard offset neck was  then placed  with a 32 + 5 trial head. The hip was then reduced. We confirmed that  the stem was in the canal both on AP and lateral x-rays. It also has excellent sizing. The hip was reduced with outstanding stability through full extension and full external rotation.. AP pelvis was taken and the leg lengths were measured and found to be equal. Hip was then dislocated again and the femoral head and neck removed. The  femoral broach was removed. Size 5 Actis stem with a standard offset  neck was then impacted into the femur following native anteversion. Has  excellent purchase in the canal. Excellent torsional and rotational and  axial stability. It is confirmed to be in the canal on AP and lateral  fluoroscopic views. The 32 + 5 ceramic head was placed and the hip  reduced with outstanding stability. Again AP pelvis was taken and it  confirmed that the leg lengths were equal. The wound was then copiously  irrigated with saline solution and the capsule reattached and repaired  with Ethibond suture. 30 ml of .25% Bupivicaine was  injected into the capsule and into the edge of the tensor fascia lata as well as subcutaneous tissue. The fascia overlying the tensor fascia lata was then closed with a running #1 V-Loc. Subcu was closed with interrupted 2-0 Vicryl and subcuticular running 4-0 Monocryl. Incision was cleaned  and dried. Steri-Strips and a bulky sterile dressing applied. Hemovac  drain was hooked to suction and then the patient was awakened and transported to  recovery in stable condition.        Please note that a surgical assistant was a medical necessity for this procedure to perform it in a safe and expeditious manner. Assistant was necessary to provide appropriate retraction of vital neurovascular structures and to prevent femoral fracture and allow for anatomic placement of the prosthesis.  Gaynelle Arabian, M.D.

## 2019-05-26 NOTE — Anesthesia Postprocedure Evaluation (Signed)
Anesthesia Post Note  Patient: Melinda Wolfe  Procedure(s) Performed: TOTAL HIP ARTHROPLASTY ANTERIOR APPROACH (Left Hip)     Patient location during evaluation: PACU Anesthesia Type: Spinal Level of consciousness: awake Pain management: pain level controlled Vital Signs Assessment: post-procedure vital signs reviewed and stable Respiratory status: spontaneous breathing Postop Assessment: no apparent nausea or vomiting Anesthetic complications: no    Last Vitals:  Vitals:   05/26/19 1502 05/26/19 1600  BP: 131/75 132/68  Pulse: (!) 102 86  Resp: 18 16  Temp: 36.9 C 36.8 C  SpO2: 94% 94%    Last Pain:  Vitals:   05/26/19 1630  TempSrc:   PainSc: 1                  [name]

## 2019-05-26 NOTE — Anesthesia Procedure Notes (Signed)
Spinal  Patient location during procedure: OR End time: 05/26/2019 10:09 AM Staffing Performed: resident/CRNA  Anesthesiologist: Belinda Block, MD Resident/CRNA: Maxwell Caul, CRNA Preanesthetic Checklist Completed: patient identified, IV checked, site marked, risks and benefits discussed, surgical consent, monitors and equipment checked, pre-op evaluation and timeout performed Spinal Block Patient position: sitting Prep: DuraPrep Patient monitoring: heart rate, cardiac monitor, continuous pulse ox and blood pressure Approach: midline Location: L3-4 Injection technique: single-shot Needle Needle type: Pencan  Needle gauge: 24 G Needle length: 10 cm Assessment Sensory level: T4 Additional Notes IV functioning, monitors applied to pt. Expiration date of kit checked and confirmed to be in date. Sterile prep and drape, hand hygiene and sterile gloved used. Pt was positioned and spine was prepped in sterile fashion. Skin was anesthetized with lidocaine. Free flow of clear CSF obtained prior to injecting local anesthetic into CSF x 1 attempt. Spinal needle aspirated freely following injection. Needle was carefully withdrawn, and pt tolerated procedure well. Loss of motor and sensory on exam post injection. Dr Nyoka Cowden at bedside during entire placement.

## 2019-05-27 ENCOUNTER — Encounter: Payer: Self-pay | Admitting: *Deleted

## 2019-06-16 ENCOUNTER — Ambulatory Visit: Payer: Medicare Other | Admitting: Cardiology

## 2019-06-25 ENCOUNTER — Telehealth: Payer: Self-pay | Admitting: Cardiology

## 2019-06-25 ENCOUNTER — Telehealth: Payer: Self-pay

## 2019-06-25 NOTE — Telephone Encounter (Signed)
I spoke with the patient just now. There was some confusion in regards to her missed appointment with Dr. Harriet Masson on 06/16/19. She asked to reschedule this and we have her scheduled for 07/08/19 at 2:55pm. She is aware of this and verbalizes understanding No other issues or concerns were noted at this time.    Encouraged patient to call back with any questions or concerns.

## 2019-06-25 NOTE — Telephone Encounter (Signed)
New Message:      Pt wanted to know when was she supposed  To have her next appt after her surgery on 05-26-19?

## 2019-06-29 ENCOUNTER — Ambulatory Visit
Admission: RE | Admit: 2019-06-29 | Discharge: 2019-06-29 | Disposition: A | Discharge status: Home / Self Care | Payer: Medicare Other | Source: Ambulatory Visit | Attending: Internal Medicine | Admitting: Internal Medicine

## 2019-06-29 ENCOUNTER — Other Ambulatory Visit: Payer: Self-pay | Admitting: General Surgery

## 2019-06-29 ENCOUNTER — Other Ambulatory Visit: Payer: Self-pay

## 2019-06-29 DIAGNOSIS — Z853 Personal history of malignant neoplasm of breast: Secondary | ICD-10-CM

## 2019-07-08 ENCOUNTER — Ambulatory Visit (INDEPENDENT_AMBULATORY_CARE_PROVIDER_SITE_OTHER): Payer: Medicare Other | Admitting: Cardiology

## 2019-07-08 ENCOUNTER — Encounter: Payer: Self-pay | Admitting: Cardiology

## 2019-07-08 ENCOUNTER — Other Ambulatory Visit: Payer: Self-pay

## 2019-07-08 VITALS — BP 120/80 | HR 110 | Ht 66.0 in | Wt 210.0 lb

## 2019-07-08 DIAGNOSIS — E669 Obesity, unspecified: Secondary | ICD-10-CM

## 2019-07-08 DIAGNOSIS — Z9889 Other specified postprocedural states: Secondary | ICD-10-CM

## 2019-07-08 DIAGNOSIS — R7303 Prediabetes: Secondary | ICD-10-CM

## 2019-07-08 DIAGNOSIS — E782 Mixed hyperlipidemia: Secondary | ICD-10-CM

## 2019-07-08 HISTORY — DX: Other specified postprocedural states: Z98.890

## 2019-07-08 NOTE — Progress Notes (Signed)
Cardiology Office Note:    Date:  07/08/2019   ID:  Melinda Wolfe, DOB [birth year], MRN [id number]  PCP:  Raina Mina., MD  Cardiologist:  No primary care provider on file.  Electrophysiologist:  None   Referring MD: Raina Mina., MD   Chief Complaint  Patient presents with  . Follow-up    History of Present Illness:    Melinda Wolfe is a 69 y.o. female with a hx of anxiety, hyperlipidemia with statin intolerance, prediabetes presents to be evaluated postoperatively.  Last saw the patient on March 18, 2019 at that time she was pending surgery.  She was able to successfully get her hip surgery which she is doing tremendously well after this procedure.  She is happy with her recovery.  During her surgery there was no complications.  There is no hypotension or hypertension.  She has not developed any chest pain.  She offers no complaints at this time.  Past Medical History:  Diagnosis Date  . Anxiety 07/19/2015   Last Assessment & Plan:  Relevant Hx: Course: Daily Update: Today's Plan:this is stable for her at this time and will follow on her current dose of meds  Electronically signed by: Mayer Camel, NP 07/20/15 1230  . Asthma    uses an inhaler  . ASTHMA 11/11/2007   Qualifier: Diagnosis of  By: Julien Girt CMA, Marliss Czar    . Breast cancer ([place]) 2018   Left Breast Cancer/ 22 radiation treatments  . Breast cancer of upper-outer quadrant of left female breast ([place]) 05/16/2016  . Complication of anesthesia    per pt,hard to wake up after sedation!  . Depression   . Gastroesophageal reflux disease without esophagitis 07/20/2015   Last Assessment & Plan:  Relevant Hx: Course: Daily Update: Today's Plan:she has been stable on the PPI  But she is trying to wean this down to qod which is fine for her to do  Electronically signed by: Mayer Camel, NP 07/20/15 1235  . GERD (gastroesophageal reflux disease)   . History of breast cancer 08/15/2016    . Hypercalcemia 07/19/2015  . Malaise and fatigue 07/19/2015   Last Assessment & Plan:  Relevant Hx: Course: Daily Update: Today's Plan:this is off and on for her and mainly with her work that is very stressful for her despite being there 30 years it is still quite hard on her  Electronically signed by: Mayer Camel, NP 07/20/15 1231  . Mild intermittent asthma without complication 0000000   Last Assessment & Plan:  Relevant Hx: Course: Daily Update: Today's Plan:this is stable for her at this time and she has inhaler to use if she needs to but she has not had to in some time  Electronically signed by: Mayer Camel, NP 07/20/15 1230  . Mixed hyperlipidemia 07/19/2015   Last Assessment & Plan:  Relevant Hx: Course: Daily Update: Today's Plan:she is going to have her lipids checked she cannot take statins due to the fact that she has such severe myalagias with them  Electronically signed by: Mayer Camel, NP 07/20/15 1231  . Osteoarthritis of hip 12/30/2018  . Personal history of radiation therapy 2018   Left Breast Cancer/ 22 treatments  . Prediabetes 11/07/2017  . Recurrent major depressive disorder ([place]) 07/19/2015   Last Assessment & Plan:  Relevant Hx: Course: Daily Update: Today's Plan:she feels this is stable for her and will follow  Electronically signed by: Mayer Camel, NP 07/20/15 1231  .  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 Vitamin D deficiency 07/19/2015   Last Assessment & Plan:  Relevant Hx: Course: Daily Update: Today's Plan:update her level for her today she is taking her vitamin d daily now and will continue with this  Electronically signed by: Mayer Camel, NP 07/20/15 1232    Past Surgical History:  Procedure Laterality Date  . BREAST BIOPSY Left 04/22/2016  . BREAST LUMPECTOMY Left 05/2016  . BREAST LUMPECTOMY WITH RADIOACTIVE SEED AND SENTINEL LYMPH NODE BIOPSY Left  05/02/2016   Procedure: BREAST LUMPECTOMY WITH RADIOACTIVE SEED AND SENTINEL LYMPH NODE BIOPSY;  Surgeon: Rolm Bookbinder, MD;  Location: [facility];  Service: General;  Laterality: Left;  . COLONOSCOPY    . POLYPECTOMY    . TOTAL HIP ARTHROPLASTY Left 05/26/2019   Procedure: TOTAL HIP ARTHROPLASTY ANTERIOR APPROACH;  Surgeon: Gaynelle Arabian, MD;  Location: [facility];  Service: Orthopedics;  Laterality: Left;  1110min    Current Medications: Current Meds  Medication Sig  . acetaminophen (TYLENOL) 325 MG tablet Take 325-650 mg by mouth every 6 (six) hours as needed (for pain.).   Marland Kitchen alendronate (FOSAMAX) 70 MG tablet Take 70 mg by mouth every Sunday.   Marland Kitchen anastrozole (ARIMIDEX) 1 MG tablet Take 1 mg by mouth every evening.   Marland Kitchen aspirin EC 81 MG tablet Take 81 mg by mouth daily.  . budesonide-formoterol (SYMBICORT) 160-4.5 MCG/ACT inhaler Inhale 2 puffs into the lungs 2 (two) times daily as needed (respiratory issues.).   Marland Kitchen montelukast (SINGULAIR) 10 MG tablet Take 10 mg by mouth daily.   Marland Kitchen omeprazole (PRILOSEC) 20 MG capsule Take 20 mg by mouth daily.  Marland Kitchen PARoxetine (PAXIL) 40 MG tablet Take 40 mg by mouth every morning.     Allergies:   Latex, Levofloxacin, and Statins   Social History   Socioeconomic History  . Marital status: Married    Spouse name: Not on file  . Number of children: Not on file  . Years of education: Not on file  . Highest education level: Not on file  Occupational History  . Not on file  Tobacco Use  . Smoking status: Former Smoker    Quit date: 08/21/2012    Years since quitting: 6.8  . Smokeless tobacco: Never Used  Substance and Sexual Activity  . Alcohol use: Yes    Comment: social  . Drug use: No  . Sexual activity: Not on file  Other Topics Concern  . Not on file  Social History Narrative  . Not on file   Social Determinants of Health   Financial Resource Strain:   . Difficulty of Paying Living Expenses:   Food Insecurity:   .  Worried About Charity fundraiser in the Last Year:   . Arboriculturist in the Last Year:   Transportation Needs:   . Film/video editor (Medical):   Marland Kitchen Lack of Transportation (Non-Medical):   Physical Activity:   . Days of Exercise per Week:   . Minutes of Exercise per Session:   Stress:   . Feeling of Stress :   Social Connections:   . Frequency of Communication with Friends and Family:   . Frequency of Social Gatherings with Friends and Family:   . Attends Religious Services:   . Active Member of Clubs or Organizations:   . Attends Archivist Meetings:   Marland Kitchen Marital Status:      Family  History: The patient's family history includes Colon cancer in her mother; Hypertension in her sister; Stroke in her father. There is no history of Breast cancer, Colon polyps, Esophageal cancer, Rectal cancer, or Stomach cancer.  ROS:   Review of Systems  Constitution: Negative for decreased appetite, fever and weight gain.  HENT: Negative for congestion, ear discharge, hoarse voice and sore throat.   Eyes: Negative for discharge, redness, vision loss in right eye and visual halos.  Cardiovascular: Negative for chest pain, dyspnea on exertion, leg swelling, orthopnea and palpitations.  Respiratory: Negative for cough, hemoptysis, shortness of breath and snoring.   Endocrine: Negative for heat intolerance and polyphagia.  Hematologic/Lymphatic: Negative for bleeding problem. Does not bruise/bleed easily.  Skin: Negative for flushing, nail changes, rash and suspicious lesions.  Musculoskeletal: Negative for arthritis, joint pain, muscle cramps, myalgias, neck pain and stiffness.  Gastrointestinal: Negative for abdominal pain, bowel incontinence, diarrhea and excessive appetite.  Genitourinary: Negative for decreased libido, genital sores and incomplete emptying.  Neurological: Negative for brief paralysis, focal weakness, headaches and loss of balance.  Psychiatric/Behavioral: Negative  for altered mental status, depression and suicidal ideas.  Allergic/Immunologic: Negative for HIV exposure and persistent infections.    EKGs/Labs/Other Studies Reviewed:    The following studies were reviewed today:   EKG: None today  Recent Labs: No results found for requested labs within last 8760 hours.  Recent Lipid Panel No results found for: CHOL, TRIG, HDL, CHOLHDL, VLDL, LDLCALC, LDLDIRECT  Physical Exam:    VS:  BP 120/80 (BP Location: Right Arm, Patient Position: Sitting, Cuff Size: Normal)   Pulse (!) 110   Ht 5\' 6"  (1.676 m)   Wt 210 lb (95.3 kg)   SpO2 95%   BMI 33.89 kg/m     Wt Readings from Last 3 Encounters:  07/08/19 210 lb (95.3 kg)  05/26/19 210 lb 9 oz (95.5 kg)  05/19/19 210 lb 9 oz (95.5 kg)     GEN: Well nourished, well developed in no acute distress HEENT: Normal NECK: No JVD; No carotid bruits LYMPHATICS: No lymphadenopathy CARDIAC: S1S2 noted,RRR, no murmurs, rubs, gallops RESPIRATORY:  Clear to auscultation without rales, wheezing or rhonchi  ABDOMEN: Soft, non-tender, non-distended, +bowel sounds, no guarding. EXTREMITIES: No edema, No cyanosis, no clubbing MUSCULOSKELETAL:  No deformity  SKIN: Warm and dry NEUROLOGIC:  Alert and oriented x 3, non-focal PSYCHIATRIC:  Normal affect, good insight  ASSESSMENT:    1. Mixed hyperlipidemia   2. Post-operative state   3. Obesity (BMI 30-39.9)   4. Prediabetes    PLAN:    Heart rate slightly elevated today I do believe this in the setting of her hip pain as well while in the visit with her I repeated her heart rates which is down to 96 bpm.  She has lost some weight since her surgery.  She is happy with her career.  She was on Xarelto transiently but has not been placed on aspirin 81 mg daily. No changes will be made to her medication regimen by me today.   The patient is in agreement with the above plan. The patient left the office in stable condition.  The patient will follow up in  1 year or sooner if needed.   Medication Adjustments/Labs and Tests Ordered: Current medicines are reviewed at length with the patient today.  Concerns regarding medicines are outlined above.  No orders of the defined types were placed in this encounter.  No orders of the defined types were placed  in this encounter.   Patient Instructions  Medication Instructions:  Your physician recommends that you continue on your current medications as directed. Please refer to the Current Medication list given to you today.  *If you need a refill on your cardiac medications before your next appointment, please call your pharmacy*   Lab Work: NONE If you have labs (blood work) drawn today and your tests are completely normal, you will receive your results only by: Marland Kitchen MyChart Message (if you have MyChart) OR . A paper copy in the mail If you have any lab test that is abnormal or we need to change your treatment, we will call you to review the results.   Testing/Procedures: NONE   Follow-Up: At [facility], you and your health needs are our priority.  As part of our continuing mission to provide you with exceptional heart care, we have created designated Provider Care Teams.  These Care Teams include your primary Cardiologist (physician) and Advanced Practice Providers (APPs -  Physician Assistants and Nurse Practitioners) who all work together to provide you with the care you need, when you need it.  We recommend signing up for the patient portal called "MyChart".  Sign up information is provided on this After Visit Summary.  MyChart is used to connect with patients for Virtual Visits (Telemedicine).  Patients are able to view lab/test results, encounter notes, upcoming appointments, etc.  Non-urgent messages can be sent to your provider as well.   To learn more about what you can do with MyChart, go to [email or web address].    Your next appointment:   1 year(s)  The format for your next  appointment:   In Person  Provider:   Berniece Salines, DO   Other Instructions      Adopting a Healthy Lifestyle.  Know what a healthy weight is for you (roughly BMI <25) and aim to maintain this   Aim for 7+ servings of fruits and vegetables daily   65-80+ fluid ounces of water or unsweet tea for healthy kidneys   Limit to max 1 drink of alcohol per day; avoid smoking/tobacco   Limit animal fats in diet for cholesterol and heart health - choose grass fed whenever available   Avoid highly processed foods, and foods high in saturated/trans fats   Aim for low stress - take time to unwind and care for your mental health   Aim for 150 min of moderate intensity exercise weekly for heart health, and weights twice weekly for bone health   Aim for 7-9 hours of sleep daily   When it comes to diets, agreement about the perfect plan isnt easy to find, even among the experts. Experts at the [employer] developed an idea known as the Healthy Eating Plate. Just imagine a plate divided into logical, healthy portions.   The emphasis is on diet quality:   Load up on vegetables and fruits - one-half of your plate: Aim for color and variety, and remember that potatoes dont count.   Go for whole grains - one-quarter of your plate: Whole wheat, barley, wheat berries, quinoa, oats, brown rice, and foods made with them. If you want pasta, go with whole wheat pasta.   Protein power - one-quarter of your plate: Fish, chicken, beans, and nuts are all healthy, versatile protein sources. Limit red meat.   The diet, however, does go beyond the plate, offering a few other suggestions.   Use healthy plant oils, such as olive, canola, soy,  corn, sunflower and peanut. Check the labels, and avoid partially hydrogenated oil, which have unhealthy trans fats.   If youre thirsty, drink water. Coffee and tea are good in moderation, but skip sugary drinks and limit milk and dairy products  to one or two daily servings.   The type of carbohydrate in the diet is more important than the amount. Some sources of carbohydrates, such as vegetables, fruits, whole grains, and beans-are healthier than others.   Finally, stay active  Signed, Berniece Salines, DO  07/08/2019 3:53 PM    [facility]

## 2019-07-08 NOTE — Patient Instructions (Signed)

## 2019-08-19 DIAGNOSIS — C50412 Malignant neoplasm of upper-outer quadrant of left female breast: Secondary | ICD-10-CM | POA: Diagnosis not present

## 2019-11-23 DIAGNOSIS — Z1159 Encounter for screening for other viral diseases: Secondary | ICD-10-CM

## 2019-11-23 DIAGNOSIS — Z6834 Body mass index (BMI) 34.0-34.9, adult: Secondary | ICD-10-CM

## 2019-11-23 DIAGNOSIS — K828 Other specified diseases of gallbladder: Secondary | ICD-10-CM | POA: Insufficient documentation

## 2019-11-23 DIAGNOSIS — Z6835 Body mass index (BMI) 35.0-35.9, adult: Secondary | ICD-10-CM | POA: Insufficient documentation

## 2019-11-23 HISTORY — DX: Body mass index (BMI) 34.0-34.9, adult: Z68.34

## 2019-11-23 HISTORY — DX: Body mass index (BMI) 35.0-35.9, adult: Z68.35

## 2019-11-23 HISTORY — DX: Encounter for screening for other viral diseases: Z11.59

## 2019-11-23 HISTORY — DX: Other specified diseases of gallbladder: K82.8

## 2019-11-26 ENCOUNTER — Telehealth: Payer: Self-pay | Admitting: *Deleted

## 2019-11-26 NOTE — Telephone Encounter (Signed)
   [facility] Pre-operative Risk Assessment    Request for surgical clearance:  1. What type of surgery is being performed? Cholecystectomy   2. When is this surgery scheduled? 12/01/19   3. What type of clearance is required (medical clearance vs. Pharmacy clearance to hold med vs. Both)? Both  4. Are there any medications that need to be held prior to surgery and how long?Aspirin  5. Practice name and name of physician performing surgery? [facility] [place], Dr. Noberto Retort  6. What is your office phone number [phone]    7.   What is your office fax number [phone]  8.   Anesthesia type (None, local, MAC, general) ? Not specified   Melinda Wolfe 11/26/2019, 5:25 PM  _________________________________________________________________   (provider comments below)

## 2019-11-29 NOTE — Telephone Encounter (Signed)
   Primary Cardiologist: No primary care provider on file.  Chart reviewed as part of pre-operative protocol coverage. Patient was contacted 11/29/2019 in reference to pre-operative risk assessment for pending surgery as outlined below.  Luann Aspinwall was last seen on 07/08/2019 by Dr. Harriet Masson.  Since that day, Manon Banbury has done well without chest pain or significant dyspnea on exertion. Myoview obtained in Jan 2021 was normal.   Therefore, based on ACC/AHA guidelines, the patient would be at acceptable risk for the planned procedure without further cardiovascular testing.   The patient was advised that if she develops new symptoms prior to surgery to contact our office to arrange for a follow-up visit, and she verbalized understanding.  I will route this recommendation to the requesting party via Epic fax function and remove from pre-op pool. Please call with questions.  [place], [place] 11/29/2019, 4:41 PM

## 2019-12-20 DIAGNOSIS — Z09 Encounter for follow-up examination after completed treatment for conditions other than malignant neoplasm: Secondary | ICD-10-CM

## 2019-12-20 HISTORY — DX: Encounter for follow-up examination after completed treatment for conditions other than malignant neoplasm: Z09

## 2020-02-17 NOTE — Progress Notes (Signed)
[facility]  [address] [place],  [place]  [address] [phone]  Clinic Day:  02/18/2020  Referring physician: Raina Mina., MD   HISTORY OF PRESENT ILLNESS:  The patient is a 69 y.o. female with stage IA (T1b N0 M0) hormone positive breast cancer, status post a lumpectomy in March 2018.  She completed her adjuvant breast radiation in May 2018.  She takes anastrozole on a daily basis for the adjuvant endocrine management of her breast cancer.  She comes in today for routine follow up.  Since her last visit, the patient has been doing very well.  She denies having any particular changes in her breasts which concern her for disease recurrence.    PHYSICAL EXAM:  Blood pressure (!) 142/70, pulse 98, temperature 98.1 F (36.7 C), resp. rate 16, height 5\' 6"  (1.676 m), weight 203 lb 1.6 oz (92.1 kg), SpO2 94 %. Wt Readings from Last 3 Encounters:  02/18/20 203 lb 1.6 oz (92.1 kg)  07/08/19 210 lb (95.3 kg)  05/26/19 210 lb 9 oz (95.5 kg)   Body mass index is 32.78 kg/m. Performance status (ECOG): 0 - Asymptomatic Physical Exam Constitutional:      Appearance: Normal appearance.  HENT:     Mouth/Throat:     Pharynx: Oropharynx is clear. No oropharyngeal exudate.  Cardiovascular:     Rate and Rhythm: Normal rate and regular rhythm.     Heart sounds: No murmur heard. No friction rub. No gallop.   Pulmonary:     Breath sounds: Normal breath sounds.  Chest:  Breasts:     Right: No swelling, bleeding, inverted nipple, mass, nipple discharge, skin change, axillary adenopathy or supraclavicular adenopathy.     Left: No swelling, bleeding, inverted nipple, mass, nipple discharge, skin change, axillary adenopathy or supraclavicular adenopathy.    Abdominal:     General: Bowel sounds are normal. There is no distension.     Palpations: Abdomen is soft. There is no mass.     Tenderness: There is no abdominal tenderness.  Musculoskeletal:         General: No tenderness.     Cervical back: Normal range of motion and neck supple.     Right lower leg: No edema.     Left lower leg: No edema.  Lymphadenopathy:     Cervical: No cervical adenopathy.     Right cervical: No superficial, deep or posterior cervical adenopathy.    Left cervical: No superficial, deep or posterior cervical adenopathy.     Upper Body:     Right upper body: No supraclavicular or axillary adenopathy.     Left upper body: No supraclavicular or axillary adenopathy.     Lower Body: No right inguinal adenopathy. No left inguinal adenopathy.  Skin:    Coloration: Skin is not jaundiced.     Findings: No lesion or rash.  Neurological:     General: No focal deficit present.     Mental Status: She is alert and oriented to person, place, and time. Mental status is at baseline.  Psychiatric:        Mood and Affect: Mood normal.        Behavior: Behavior normal.        Thought Content: Thought content normal.        Judgment: Judgment normal.    ASSESSMENT & PLAN:  Assessment/Plan:  A 69 y.o. female with stage IA (T1b N0 M0) hormone positive breast cancer, status post a lumpectomy  in March 2018.  She also completed adjuvant breast radiation in June 2018.  Based upon her clinical breast exam today, the patient remains disease-free.  Clinically, the patient continues to do very well.  She knows to continue taking her anastrozole on a daily basis to complete 5 total years of adjuvant endocrine therapy.  I will see her back in 6 months for repeat clinical assessment.  Her annual mammogram will be scheduled before her next visit for her continued radiographic breast cancer surveillance. The patient understands all the plans discussed today and is in agreement with them.     Sumedha Munnerlyn Macarthur Critchley, MD

## 2020-02-18 ENCOUNTER — Inpatient Hospital Stay: Payer: Medicare Other | Attending: Oncology | Admitting: Oncology

## 2020-02-18 ENCOUNTER — Other Ambulatory Visit: Payer: Self-pay

## 2020-02-18 ENCOUNTER — Other Ambulatory Visit: Payer: Self-pay | Admitting: Oncology

## 2020-02-18 ENCOUNTER — Telehealth: Payer: Self-pay | Admitting: Oncology

## 2020-02-18 VITALS — BP 142/70 | HR 98 | Temp 98.1°F | Resp 16 | Ht 66.0 in | Wt 203.1 lb

## 2020-02-18 DIAGNOSIS — C50412 Malignant neoplasm of upper-outer quadrant of left female breast: Secondary | ICD-10-CM

## 2020-02-18 DIAGNOSIS — Z170 Estrogen receptor positive status [ER+]: Secondary | ICD-10-CM

## 2020-02-18 NOTE — Telephone Encounter (Signed)
Per 12/17 LOS, patient scheduled for 06/29/2020 Mammogram - 08/18/2020 Follow Up Att

## 2020-02-18 NOTE — Telephone Encounter (Signed)
Per 12/17 LOS. [atoemt

## 2020-02-22 ENCOUNTER — Other Ambulatory Visit: Payer: Self-pay

## 2020-02-22 DIAGNOSIS — Z170 Estrogen receptor positive status [ER+]: Secondary | ICD-10-CM

## 2020-02-22 MED ORDER — ANASTROZOLE 1 MG PO TABS: 1.0000 mg | ORAL_TABLET | Freq: Every evening | ORAL | 3 refills | Status: DC

## 2020-02-22 NOTE — Telephone Encounter (Signed)
Sent!

## 2020-04-06 DIAGNOSIS — Z85828 Personal history of other malignant neoplasm of skin: Secondary | ICD-10-CM

## 2020-04-06 HISTORY — DX: Personal history of other malignant neoplasm of skin: Z85.828

## 2020-07-05 DIAGNOSIS — J45909 Unspecified asthma, uncomplicated: Secondary | ICD-10-CM | POA: Insufficient documentation

## 2020-07-05 DIAGNOSIS — T8859XA Other complications of anesthesia, initial encounter: Secondary | ICD-10-CM | POA: Insufficient documentation

## 2020-07-05 DIAGNOSIS — F32A Depression, unspecified: Secondary | ICD-10-CM | POA: Insufficient documentation

## 2020-07-05 DIAGNOSIS — K219 Gastro-esophageal reflux disease without esophagitis: Secondary | ICD-10-CM | POA: Insufficient documentation

## 2020-07-07 ENCOUNTER — Encounter: Payer: Self-pay | Admitting: Cardiology

## 2020-07-07 ENCOUNTER — Other Ambulatory Visit: Payer: Self-pay

## 2020-07-07 ENCOUNTER — Ambulatory Visit (INDEPENDENT_AMBULATORY_CARE_PROVIDER_SITE_OTHER): Payer: Medicare Other | Admitting: Cardiology

## 2020-07-07 VITALS — BP 128/64 | HR 70 | Ht 66.0 in | Wt 207.2 lb

## 2020-07-07 DIAGNOSIS — E782 Mixed hyperlipidemia: Secondary | ICD-10-CM | POA: Diagnosis not present

## 2020-07-07 DIAGNOSIS — R9431 Abnormal electrocardiogram [ECG] [EKG]: Secondary | ICD-10-CM

## 2020-07-07 DIAGNOSIS — Z6834 Body mass index (BMI) 34.0-34.9, adult: Secondary | ICD-10-CM

## 2020-07-07 NOTE — Progress Notes (Signed)
Cardiology Office Note:    Date:  07/07/2020   ID:  Kandace Blitz, DOB 22-Dec-1950, MRN [id number]  PCP:  Mayer Camel, NP  Cardiologist:  None  Electrophysiologist:  None   Referring MD: Bess Harvest*     History of Present Illness:    Melinda Wolfe is a 70 y.o. female with a hx of  anxiety, hyperlipidemia with statin intolerance, prediabetes presents to for a follow up visit.   She offers no complaints at this time.  Past Medical History:  Diagnosis Date  . Anxiety 07/19/2015   Last Assessment & Plan:  Relevant Hx: Course: Daily Update: Today's Plan:this is stable for her at this time and will follow on her current dose of meds  Electronically signed by: Mayer Camel, NP 07/20/15 1230  . Asthma    uses an inhaler  . ASTHMA 11/11/2007   Qualifier: Diagnosis of  By: Julien Girt CMA, Marliss Czar    . Breast cancer ([facility]) 2018   Left Breast Cancer/ 22 radiation treatments  . Breast cancer of upper-outer quadrant of left female breast ([facility]) 05/16/2016  . Complication of anesthesia    per pt,hard to wake up after sedation!  . Depression   . Gastroesophageal reflux disease without esophagitis 07/20/2015   Last Assessment & Plan:  Relevant Hx: Course: Daily Update: Today's Plan:she has been stable on the PPI  But she is trying to wean this down to qod which is fine for her to do  Electronically signed by: Mayer Camel, NP 07/20/15 1235  . GERD (gastroesophageal reflux disease)   . History of breast cancer 08/15/2016  . Hypercalcemia 07/19/2015  . Malaise and fatigue 07/19/2015   Last Assessment & Plan:  Relevant Hx: Course: Daily Update: Today's Plan:this is off and on for her and mainly with her work that is very stressful for her despite being there 30 years it is still quite hard on her  Electronically signed by: Mayer Camel, NP 07/20/15 1231  . Mild intermittent asthma without complication 05/17/1759   Last  Assessment & Plan:  Relevant Hx: Course: Daily Update: Today's Plan:this is stable for her at this time and she has inhaler to use if she needs to but she has not had to in some time  Electronically signed by: Mayer Camel, NP 07/20/15 1230  . Mixed hyperlipidemia 07/19/2015   Last Assessment & Plan:  Relevant Hx: Course: Daily Update: Today's Plan:she is going to have her lipids checked she cannot take statins due to the fact that she has such severe myalagias with them  Electronically signed by: Mayer Camel, NP 07/20/15 1231  . Osteoarthritis of hip 12/30/2018  . Personal history of radiation therapy 2018   Left Breast Cancer/ 22 treatments  . Prediabetes 11/07/2017  . Recurrent major depressive disorder ([place]) 07/19/2015   Last Assessment & Plan:  Relevant Hx: Course: Daily Update: Today's Plan:she feels this is stable for her and will follow  Electronically signed by: Mayer Camel, NP 07/20/15 1231  . 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 Vitamin D deficiency 07/19/2015   Last Assessment & Plan:  Relevant Hx: Course: Daily Update: Today's Plan:update her level for her today she is taking her vitamin d daily now and will continue with this  Electronically signed by: Mayer Camel, NP 07/20/15 1232    Past Surgical History:  Procedure Laterality Date  . BREAST BIOPSY  Left 04/22/2016  . BREAST LUMPECTOMY Left 05/2016  . BREAST LUMPECTOMY WITH RADIOACTIVE SEED AND SENTINEL LYMPH NODE BIOPSY Left 05/02/2016   Procedure: BREAST LUMPECTOMY WITH RADIOACTIVE SEED AND SENTINEL LYMPH NODE BIOPSY;  Surgeon: Rolm Bookbinder, MD;  Location: [facility];  Service: General;  Laterality: Left;  . COLONOSCOPY    . POLYPECTOMY    . TOTAL HIP ARTHROPLASTY Left 05/26/2019   Procedure: TOTAL HIP ARTHROPLASTY ANTERIOR APPROACH;  Surgeon: Gaynelle Arabian, MD;  Location: [facility];  Service: Orthopedics;  Laterality:  Left;  188min    Current Medications: Current Meds  Medication Sig  . acetaminophen (TYLENOL) 325 MG tablet Take 325-650 mg by mouth every 6 (six) hours as needed (for pain.).   Marland Kitchen anastrozole (ARIMIDEX) 1 MG tablet Take 1 tablet (1 mg total) by mouth every evening.  Marland Kitchen aspirin EC 81 MG tablet Take 81 mg by mouth daily.  . montelukast (SINGULAIR) 10 MG tablet Take 10 mg by mouth daily.   Marland Kitchen omeprazole (PRILOSEC) 20 MG capsule Take 20 mg by mouth daily.  Marland Kitchen PARoxetine (PAXIL) 40 MG tablet Take 40 mg by mouth every morning.     Allergies:   Latex, Levofloxacin, and Statins   Social History   Socioeconomic History  . Marital status: Married    Spouse name: Not on file  . Number of children: Not on file  . Years of education: Not on file  . Highest education level: Not on file  Occupational History  . Not on file  Tobacco Use  . Smoking status: Former Smoker    Quit date: 08/21/2012    Years since quitting: 7.8  . Smokeless tobacco: Never Used  Vaping Use  . Vaping Use: Former  Substance and Sexual Activity  . Alcohol use: Yes    Comment: social  . Drug use: No  . Sexual activity: Not on file  Other Topics Concern  . Not on file  Social History Narrative  . Not on file   Social Determinants of Health   Financial Resource Strain: Not on file  Food Insecurity: Not on file  Transportation Needs: Not on file  Physical Activity: Not on file  Stress: Not on file  Social Connections: Not on file     Family History: The patient's family history includes Colon cancer in her mother; Hypertension in her sister; Stroke in her father. There is no history of Breast cancer, Colon polyps, Esophageal cancer, Rectal cancer, or Stomach cancer.  ROS:   Review of Systems  Constitution: Negative for decreased appetite, fever and weight gain.  HENT: Negative for congestion, ear discharge, hoarse voice and sore throat.   Eyes: Negative for discharge, redness, vision loss in right eye and  visual halos.  Cardiovascular: Negative for chest pain, dyspnea on exertion, leg swelling, orthopnea and palpitations.  Respiratory: Negative for cough, hemoptysis, shortness of breath and snoring.   Endocrine: Negative for heat intolerance and polyphagia.  Hematologic/Lymphatic: Negative for bleeding problem. Does not bruise/bleed easily.  Skin: Negative for flushing, nail changes, rash and suspicious lesions.  Musculoskeletal: Negative for arthritis, joint pain, muscle cramps, myalgias, neck pain and stiffness.  Gastrointestinal: Negative for abdominal pain, bowel incontinence, diarrhea and excessive appetite.  Genitourinary: Negative for decreased libido, genital sores and incomplete emptying.  Neurological: Negative for brief paralysis, focal weakness, headaches and loss of balance.  Psychiatric/Behavioral: Negative for altered mental status, depression and suicidal ideas.  Allergic/Immunologic: Negative for HIV exposure and persistent infections.    EKGs/Labs/Other Studies  Reviewed:    The following studies were reviewed today:   EKG:  The ekg ordered today demonstrates sinus rhythm, heart rate 70 bpm, compared to prior EKG no significant change.  Recent Labs: No results found for requested labs within last 8760 hours.  Recent Lipid Panel No results found for: CHOL, TRIG, HDL, CHOLHDL, VLDL, LDLCALC, LDLDIRECT  Physical Exam:    VS:  BP 128/64   Pulse 70   Ht 5\' 6"  (1.676 m)   Wt 207 lb 3.2 oz (94 kg)   SpO2 98%   BMI 33.44 kg/m     Wt Readings from Last 3 Encounters:  07/07/20 207 lb 3.2 oz (94 kg)  02/18/20 203 lb 1.6 oz (92.1 kg)  07/08/19 210 lb (95.3 kg)     GEN: Well nourished, well developed in no acute distress HEENT: Normal NECK: No JVD; No carotid bruits LYMPHATICS: No lymphadenopathy CARDIAC: S1S2 noted,RRR, no murmurs, rubs, gallops RESPIRATORY:  Clear to auscultation without rales, wheezing or rhonchi  ABDOMEN: Soft, non-tender, non-distended, +bowel  sounds, no guarding. EXTREMITIES: No edema, No cyanosis, no clubbing MUSCULOSKELETAL:  No deformity  SKIN: Warm and dry NEUROLOGIC:  Alert and oriented x 3, non-focal PSYCHIATRIC:  Normal affect, good insight  ASSESSMENT:    1. Abnormal EKG   2. Mixed hyperlipidemia   3. BMI 34.0-34.9,adult    PLAN:     1.  She is doing well from a cardiovascular standpoint.  No medication changes were made at this time.   The patient is in agreement with the above plan. The patient left the office in stable condition.  The patient will follow up in 1 year or sooner if needed.   Medication Adjustments/Labs and Tests Ordered: Current medicines are reviewed at length with the patient today.  Concerns regarding medicines are outlined above.  Orders Placed This Encounter  Procedures  . EKG 12-Lead   No orders of the defined types were placed in this encounter.   Patient Instructions  Medication Instructions:  Your physician recommends that you continue on your current medications as directed. Please refer to the Current Medication list given to you today.  *If you need a refill on your cardiac medications before your next appointment, please call your pharmacy*   Lab Work: None If you have labs (blood work) drawn today and your tests are completely normal, you will receive your results only by: Marland Kitchen MyChart Message (if you have MyChart) OR . A paper copy in the mail If you have any lab test that is abnormal or we need to change your treatment, we will call you to review the results.   Testing/Procedures: None   Follow-Up: At [facility], you and your health needs are our priority.  As part of our continuing mission to provide you with exceptional heart care, we have created designated Provider Care Teams.  These Care Teams include your primary Cardiologist (physician) and Advanced Practice Providers (APPs -  Physician Assistants and Nurse Practitioners) who all work together to provide  you with the care you need, when you need it.  We recommend signing up for the patient portal called "MyChart".  Sign up information is provided on this After Visit Summary.  MyChart is used to connect with patients for Virtual Visits (Telemedicine).  Patients are able to view lab/test results, encounter notes, upcoming appointments, etc.  Non-urgent messages can be sent to your provider as well.   To learn more about what you can do with MyChart, go to  [email or web address].    Your next appointment:   1 year(s)  The format for your next appointment:   In Person  Provider:   Berniece Salines, DO   Other Instructions      Adopting a Healthy Lifestyle.  Know what a healthy weight is for you (roughly BMI <25) and aim to maintain this   Aim for 7+ servings of fruits and vegetables daily   65-80+ fluid ounces of water or unsweet tea for healthy kidneys   Limit to max 1 drink of alcohol per day; avoid smoking/tobacco   Limit animal fats in diet for cholesterol and heart health - choose grass fed whenever available   Avoid highly processed foods, and foods high in saturated/trans fats   Aim for low stress - take time to unwind and care for your mental health   Aim for 150 min of moderate intensity exercise weekly for heart health, and weights twice weekly for bone health   Aim for 7-9 hours of sleep daily   When it comes to diets, agreement about the perfect plan isnt easy to find, even among the experts. Experts at the [employer] developed an idea known as the Healthy Eating Plate. Just imagine a plate divided into logical, healthy portions.   The emphasis is on diet quality:   Load up on vegetables and fruits - one-half of your plate: Aim for color and variety, and remember that potatoes dont count.   Go for whole grains - one-quarter of your plate: Whole wheat, barley, wheat berries, quinoa, oats, brown rice, and foods made with them. If you want pasta,  go with whole wheat pasta.   Protein power - one-quarter of your plate: Fish, chicken, beans, and nuts are all healthy, versatile protein sources. Limit red meat.   The diet, however, does go beyond the plate, offering a few other suggestions.   Use healthy plant oils, such as olive, canola, soy, corn, sunflower and peanut. Check the labels, and avoid partially hydrogenated oil, which have unhealthy trans fats.   If youre thirsty, drink water. Coffee and tea are good in moderation, but skip sugary drinks and limit milk and dairy products to one or two daily servings.   The type of carbohydrate in the diet is more important than the amount. Some sources of carbohydrates, such as vegetables, fruits, whole grains, and beans-are healthier than others.   Finally, stay active  Signed, Berniece Salines, DO  07/07/2020 8:29 PM    [facility]

## 2020-07-07 NOTE — Patient Instructions (Signed)

## 2020-07-11 ENCOUNTER — Encounter: Payer: Self-pay | Admitting: Oncology

## 2020-08-15 NOTE — Progress Notes (Signed)
[facility]  [address] [place],  [place]  [address] [phone]  Clinic Day:  08/18/2020  Referring physician: Bess Harvest*  This document serves as a record of services personally performed by Marice Potter, MD. It was created on their behalf by Curry,Lauren E, a trained medical scribe. The creation of this record is based on the scribe's personal observations and the provider's statements to them.  HISTORY OF PRESENT ILLNESS:  The patient is a 70 y.o. female with stage IA (T1b N0 M0) hormone positive breast cancer, status post a lumpectomy in March 2018.  She completed her adjuvant breast radiation in May 2018.  She takes anastrozole on a daily basis for the adjuvant endocrine management of her breast cancer.  She comes in today for routine follow up.  Since her last visit, the patient has been doing very well.  She denies having any particular changes in her breasts which concern her for disease recurrence.  Of note, her annual mammogram in April 2022 showed no evidence of disease recurrence.    PHYSICAL EXAM:  Blood pressure (!) 146/67, pulse (!) 103, temperature 97.8 F (36.6 C), resp. rate 16, height 5\' 6"  (1.676 m), weight 207 lb (93.9 kg), SpO2 92 %. Wt Readings from Last 3 Encounters:  08/18/20 207 lb (93.9 kg)  07/07/20 207 lb 3.2 oz (94 kg)  02/18/20 203 lb 1.6 oz (92.1 kg)   Body mass index is 33.41 kg/m. Performance status (ECOG): 0 - Asymptomatic Physical Exam Constitutional:      Appearance: Normal appearance.  HENT:     Mouth/Throat:     Pharynx: Oropharynx is clear. No oropharyngeal exudate.  Cardiovascular:     Rate and Rhythm: Normal rate and regular rhythm.     Heart sounds: No murmur heard.   No friction rub. No gallop.  Pulmonary:     Breath sounds: Normal breath sounds.  Chest:  Breasts:    Right: No swelling, bleeding, inverted nipple, mass, nipple discharge, skin change, axillary adenopathy or  supraclavicular adenopathy.     Left: No swelling, bleeding, inverted nipple, mass, nipple discharge, skin change, axillary adenopathy or supraclavicular adenopathy.  Abdominal:     General: Bowel sounds are normal. There is no distension.     Palpations: Abdomen is soft. There is no mass.     Tenderness: There is no abdominal tenderness.  Musculoskeletal:        General: No tenderness.     Cervical back: Normal range of motion and neck supple.     Right lower leg: No edema.     Left lower leg: No edema.  Lymphadenopathy:     Cervical: No cervical adenopathy.     Right cervical: No superficial, deep or posterior cervical adenopathy.    Left cervical: No superficial, deep or posterior cervical adenopathy.     Upper Body:     Right upper body: No supraclavicular or axillary adenopathy.     Left upper body: No supraclavicular or axillary adenopathy.     Lower Body: No right inguinal adenopathy. No left inguinal adenopathy.  Skin:    Coloration: Skin is not jaundiced.     Findings: No lesion or rash.  Neurological:     General: No focal deficit present.     Mental Status: She is alert and oriented to person, place, and time. Mental status is at baseline.  Psychiatric:        Mood and Affect: Mood normal.  Behavior: Behavior normal.        Thought Content: Thought content normal.        Judgment: Judgment normal.   MAMMOGRAPHY RESULTS:  EXAM: 06/29/2020 DIGITAL DIAGNOSTIC BILATERAL MAMMOGRAM WITH TOMOSYNTHESIS AND CAD  ACR Breast Density Category c: The breast tissue is heterogeneously dense, which may obscure small masses.  FINDINGS: Exam demonstrates stable post lumpectomy changes over the upper-outer quadrant of the left breast. Remainder of the left breast as well as the right breast is unchanged.  IMPRESSION: Stable post lumpectomy changes of the left breast.  ASSESSMENT & PLAN:  Assessment/Plan:  A 70 y.o. female with stage IA (T1b N0 M0) hormone positive breast  cancer, status post a lumpectomy in March 2018.  She also completed adjuvant breast radiation in June 2018.  Based upon her clinical breast exam today and her recent mammogram, the patient remains disease-free.  Clinically, the patient continues to do very well.  She knows to continue taking her anastrozole on a daily basis to complete 5 total years of adjuvant endocrine therapy.  I will see her back in 6 months for repeat clinical assessment.  The patient understands all the plans discussed today and is in agreement with them.    I, Rita Ohara, am acting as scribe for Marice Potter, MD    I have reviewed this report as typed by the medical scribe, and it is complete and accurate.  Dequincy Macarthur Critchley, MD

## 2020-08-18 ENCOUNTER — Other Ambulatory Visit: Payer: Self-pay

## 2020-08-18 ENCOUNTER — Telehealth: Payer: Self-pay | Admitting: Oncology

## 2020-08-18 ENCOUNTER — Inpatient Hospital Stay: Payer: Medicare Other | Attending: Oncology | Admitting: Oncology

## 2020-08-18 VITALS — BP 146/67 | HR 103 | Temp 97.8°F | Resp 16 | Ht 66.0 in | Wt 207.0 lb

## 2020-08-18 DIAGNOSIS — C50412 Malignant neoplasm of upper-outer quadrant of left female breast: Secondary | ICD-10-CM | POA: Diagnosis not present

## 2020-08-18 DIAGNOSIS — Z170 Estrogen receptor positive status [ER+]: Secondary | ICD-10-CM | POA: Diagnosis not present

## 2020-08-18 NOTE — Telephone Encounter (Signed)
Per 6/17 los next appt scheduled and confirmed by patient

## 2021-02-09 NOTE — Progress Notes (Signed)
Melinda Wolfe  [address] [place],  [place]  [address] [phone]  Clinic Day:  02/16/2021  Referring physician: Bess Wolfe*  This document serves as a record of services personally performed by Melinda Potter, MD. It was created on their behalf by Melinda Wolfe, a trained medical scribe. The creation of this record is based on the scribe's personal observations and the provider's statements to them.  HISTORY OF PRESENT ILLNESS:  The patient is a 70 y.o. female with stage IA (T1b N0 M0) hormone positive breast cancer, status post a lumpectomy in March 2018.  She completed her adjuvant breast radiation in May 2018.  She takes anastrozole on a daily basis for the adjuvant endocrine management of her breast cancer.  She comes in today for routine follow up.  Since her last visit, the patient has been doing very well.  She denies having any particular changes in her breasts which concern her for disease recurrence.  Of note, her annual mammogram in April 2022 showed no evidence of disease recurrence.    PHYSICAL EXAM:  Blood pressure (!) 174/77, pulse (!) 105, temperature 98.3 F (36.8 C), resp. rate 16, height 5\' 6"  (1.676 m), weight 215 lb 4.8 oz (97.7 kg), SpO2 96 %. Wt Readings from Last 3 Encounters:  02/16/21 215 lb 4.8 oz (97.7 kg)  08/18/20 207 lb (93.9 kg)  07/07/20 207 lb 3.2 oz (94 kg)   Body mass index is 34.75 kg/m. Performance status (ECOG): 0 - Asymptomatic Physical Exam Constitutional:      Appearance: Normal appearance.  HENT:     Mouth/Throat:     Pharynx: Oropharynx is clear. No oropharyngeal exudate.  Cardiovascular:     Rate and Rhythm: Normal rate and regular rhythm.     Heart sounds: No murmur heard.   No friction rub. No gallop.  Pulmonary:     Breath sounds: Normal breath sounds.  Chest:  Breasts:    Right: No swelling, bleeding, inverted nipple, mass, nipple discharge or skin change.     Left: No  swelling, bleeding, inverted nipple, mass, nipple discharge or skin change.  Abdominal:     General: Bowel sounds are normal. There is no distension.     Palpations: Abdomen is soft. There is no mass.     Tenderness: There is no abdominal tenderness.  Musculoskeletal:        General: No tenderness.     Cervical back: Normal range of motion and neck supple.     Right lower leg: No edema.     Left lower leg: No edema.  Lymphadenopathy:     Cervical: No cervical adenopathy.     Right cervical: No superficial, deep or posterior cervical adenopathy.    Left cervical: No superficial, deep or posterior cervical adenopathy.     Upper Body:     Right upper body: No supraclavicular or axillary adenopathy.     Left upper body: No supraclavicular or axillary adenopathy.     Lower Body: No right inguinal adenopathy. No left inguinal adenopathy.  Skin:    Coloration: Skin is not jaundiced.     Findings: No lesion or rash.  Neurological:     General: No focal deficit present.     Mental Status: She is alert and oriented to person, place, and time. Mental status is at baseline.  Psychiatric:        Mood and Affect: Mood normal.        Behavior:  Behavior normal.        Thought Content: Thought content normal.        Judgment: Judgment normal.   ASSESSMENT & PLAN:  Assessment/Plan:  A 70 y.o. female with stage IA (T1b N0 M0) hormone positive breast cancer, status post a lumpectomy in March 2018.  She also completed adjuvant breast radiation in June 2018.  Based upon her clinical breast exam today, the patient remains disease-free.  Clinically, the patient continues to do very well.  She knows to continue taking her anastrozole on a daily basis to complete 5 total years of adjuvant endocrine therapy.  I will see her back in 6 months for repeat clinical assessment.  Her annual mammogram will be scheduled before her next visit.  If her next mammogram and clinical breast exam are both normal, I will begin  spacing all future visits out to once per year.  The patient understands all the plans discussed today and is in agreement with them.    I, Melinda Wolfe, am acting as scribe for Melinda Potter, MD    I have reviewed this report as typed by the medical scribe, and it is complete and accurate.  Melinda Thieme Macarthur Critchley, MD

## 2021-02-13 ENCOUNTER — Other Ambulatory Visit: Payer: Self-pay | Admitting: Hematology and Oncology

## 2021-02-13 DIAGNOSIS — Z170 Estrogen receptor positive status [ER+]: Secondary | ICD-10-CM

## 2021-02-13 DIAGNOSIS — C50412 Malignant neoplasm of upper-outer quadrant of left female breast: Secondary | ICD-10-CM

## 2021-02-16 ENCOUNTER — Inpatient Hospital Stay: Payer: Medicare Other | Attending: Oncology | Admitting: Oncology

## 2021-02-16 VITALS — BP 174/77 | HR 105 | Temp 98.3°F | Resp 16 | Ht 66.0 in | Wt 215.3 lb

## 2021-02-16 DIAGNOSIS — Z170 Estrogen receptor positive status [ER+]: Secondary | ICD-10-CM

## 2021-02-16 DIAGNOSIS — C50412 Malignant neoplasm of upper-outer quadrant of left female breast: Secondary | ICD-10-CM

## 2021-05-16 ENCOUNTER — Telehealth: Payer: Self-pay

## 2021-05-16 ENCOUNTER — Other Ambulatory Visit: Payer: Self-pay | Admitting: Hematology and Oncology

## 2021-05-16 MED ORDER — NYSTATIN 100000 UNIT/GM EX POWD: 1.0000 "application " | Freq: Three times a day (TID) | CUTANEOUS | 0 refills | Status: DC

## 2021-05-16 NOTE — Telephone Encounter (Addendum)
RE: Moisture under right breast ?Received: Today ?Melodye Ped, NP  Dairl Ponder, RN ?I sent in prescription for nystatin powder.   ?  ? ?Pt came into clinic to get advice regarding area under her right breast. Pt wanted to make sure it wasn't anything to be worried about. She reports, "the breast is gaulded like. It is red and oozing stuff that is sticky, and smelly." She reports this has happened at different times, sometimes on the right side, sometimes on the left.  Pt states that skin isn't opened. (Pt has hx left breat cancer in 2018-tx here by Dr Bobby Rumpf. She came here because she said Dr Bobby Rumpf told her if this happened again to come by and let him see it). ? ?Pt had me to look at the area under her breast. The area is slightly pink, and looks like it is just been left moist/sweaty from breast skin to chest wall contact. I do not see any indicator of yeast. Pt has large breasts and admits she is still having hot flashes from anastrozole. I encouraged pt to keep something dry, preferably cotton/breathable material, between her breasts and chest wall to absorb moisture.  The odor she smells I think is from the moisture. She was very concerned that she smelled unclean. "I've never smelled bad in my life". I assured her I couldn't smell anything. I told her she could also apply saline soaks under breast for 15-20 minutes a couple times a day, & pat dry.  I reported all of the above to [facility] P,NP for recommendations. ?

## 2021-07-31 ENCOUNTER — Encounter: Payer: Self-pay | Admitting: Oncology

## 2021-08-10 ENCOUNTER — Encounter: Payer: Self-pay | Admitting: Cardiology

## 2021-08-10 ENCOUNTER — Ambulatory Visit (INDEPENDENT_AMBULATORY_CARE_PROVIDER_SITE_OTHER): Payer: Medicare Other | Admitting: Cardiology

## 2021-08-10 VITALS — BP 126/80 | HR 104 | Ht 65.6 in | Wt 210.4 lb

## 2021-08-10 DIAGNOSIS — R9431 Abnormal electrocardiogram [ECG] [EKG]: Secondary | ICD-10-CM

## 2021-08-10 DIAGNOSIS — Z853 Personal history of malignant neoplasm of breast: Secondary | ICD-10-CM | POA: Diagnosis not present

## 2021-08-10 DIAGNOSIS — Z6834 Body mass index (BMI) 34.0-34.9, adult: Secondary | ICD-10-CM | POA: Diagnosis not present

## 2021-08-10 DIAGNOSIS — E782 Mixed hyperlipidemia: Secondary | ICD-10-CM

## 2021-08-10 NOTE — Patient Instructions (Addendum)
Medication Instructions:  Your physician recommends that you continue on your current medications as directed. Please refer to the Current Medication list given to you today.  *If you need a refill on your cardiac medications before your next appointment, please call your pharmacy*   Lab Work: None Ordered If you have labs (blood work) drawn today and your tests are completely normal, you will receive your results only by: [place] (if you have MyChart) OR A paper copy in the mail If you have any lab test that is abnormal or we need to change your treatment, we will call you to review the results.   Testing/Procedures: We will order CT coronary calcium score. It will cost $99.00 and is not covered by insurance.  Please call to schedule.    [facility]  [address] [place]  [place], [place] [address] [phone]            Or [facility] [address] [place], [place] [address] [phone]     Follow-Up: At [facility], you and your health needs are our priority.  As part of our continuing mission to provide you with exceptional heart care, we have created designated Provider Care Teams.  These Care Teams include your primary Cardiologist (physician) and Advanced Practice Providers (APPs -  Physician Assistants and Nurse Practitioners) who all work together to provide you with the care you need, when you need it.  We recommend signing up for the patient portal called "MyChart".  Sign up information is provided on this After Visit Summary.  MyChart is used to connect with patients for Virtual Visits (Telemedicine).  Patients are able to view lab/test results, encounter notes, upcoming appointments, etc.  Non-urgent messages can be sent to your provider as well.   To learn more about what you can do with MyChart, go to [email or web address].    Your next appointment:   12 month(s)  The format for your next appointment:   In Person  Provider:    Jyl Heinz, MD    Other Instructions NA

## 2021-08-10 NOTE — Progress Notes (Signed)
Cardiology Office Note:    Date:  08/10/2021   ID:  Melinda Wolfe, DOB 1950-06-25, MRN [id number]  PCP:  Mayer Camel, NP  Cardiologist:  Jenean Lindau, MD   Referring MD: Bess Harvest*    ASSESSMENT:    1. Mixed hyperlipidemia   2. BMI 34.0-34.9,adult   3. History of breast cancer    PLAN:    In order of problems listed above:  Primary prevention stressed with the patient.  Importance of compliance with diet medication stressed and she vocalized understanding.  She was advised to walk at least half an hour a day 5 days a week and she promises to do so. Mixed dyslipidemia: Diet was emphasized.  Lifestyle modification urged.  I would like to get her a calcium scoring CT scan to assess for coronary restratification and she is agreeable.  This will help me guide lipid therapy. Obesity: Weight reduction stressed diet emphasized.  Lifestyle modification urged.  Risks of obesity explained and she promises to do better. Patient will be seen in follow-up appointment in 12 months or earlier if the patient has any concerns    Medication Adjustments/Labs and Tests Ordered: Current medicines are reviewed at length with the patient today.  Concerns regarding medicines are outlined above.  No orders of the defined types were placed in this encounter.  No orders of the defined types were placed in this encounter.    No chief complaint on file.    History of Present Illness:    Melinda Wolfe is a 71 y.o. female.  Patient has past medical history of mixed dyslipidemia and obesity.  She denies any problems at this time and takes care of activities of daily living.  She has history of breast cancer.  She leads a sedentary lifestyle.  At the time of my evaluation, the patient is alert awake oriented and in no distress.  She mentions to me that she has been told of having sinus tachycardia in the past.  Past Medical History:  Diagnosis Date    Abnormal CXR 03/30/2016   Abnormal EKG 03/18/2019   Anxiety 07/19/2015   Last Assessment & Plan:  Relevant Hx: Course: Daily Update: Today's Plan:this is stable for her at this time and will follow on her current dose of meds  Electronically signed by: Mayer Camel, NP 07/20/15 1230   Asthma    uses an inhaler   ASTHMA 11/11/2007   Qualifier: Diagnosis of  By: Julien Girt CMA, Leigh     Biliary dyskinesia 11/23/2019   BMI 34.0-34.9,adult 11/23/2019   BMI 35.0-35.9,adult 11/23/2019   Breast cancer ([place]) 2018   Left Breast Cancer/ 22 radiation treatments   Breast cancer of upper-outer quadrant of left female breast ([place]) 6/72/0947   Complication of anesthesia    per pt,hard to wake up after sedation!   Depression    Gastroesophageal reflux disease without esophagitis 07/20/2015   Last Assessment & Plan:  Relevant Hx: Course: Daily Update: Today's Plan:she has been stable on the PPI  But she is trying to wean this down to qod which is fine for her to do  Electronically signed by: Mayer Camel, NP 07/20/15 1235   GERD (gastroesophageal reflux disease)    History of basal cell cancer 04/06/2020   Formatting of this note might be different from the original. Following closely with dermatology   History of breast cancer 08/15/2016   History of COVID-19 05/12/2019   Hypercalcemia 07/19/2015  Malaise and fatigue 07/19/2015   Last Assessment & Plan:  Relevant Hx: Course: Daily Update: Today's Plan:this is off and on for her and mainly with her work that is very stressful for her despite being there 30 years it is still quite hard on her  Electronically signed by: Mayer Camel, NP 07/20/15 1231   Mild intermittent asthma without complication 4/78/2956   Last Assessment & Plan:  Relevant Hx: Course: Daily Update: Today's Plan:this is stable for her at this time and she has inhaler to use if she needs to but she has not had to in some time  Electronically signed by: Mayer Camel, NP 07/20/15 1230   Mixed hyperlipidemia 07/19/2015   Last Assessment & Plan:  Relevant Hx: Course: Daily Update: Today's Plan:she is going to have her lipids checked she cannot take statins due to the fact that she has such severe myalagias with them  Electronically signed by: Mayer Camel, NP 07/20/15 1231   Osteoarthritis of hip 12/30/2018   Personal history of radiation therapy 2018   Left Breast Cancer/ 22 treatments   Post-operative state 07/08/2019   Postoperative examination 12/20/2019   Prediabetes 11/07/2017   Recurrent major depressive disorder ([place]) 07/19/2015   Last Assessment & Plan:  Relevant Hx: Course: Daily Update: Today's Plan:she feels this is stable for her and will follow  Electronically signed by: Mayer Camel, NP 07/20/15 1231   Special screening examination for viral disease 11/23/2019   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   Qualifier: Diagnosis of  By: Julien Girt CMA, Leigh     Vitamin D deficiency 07/19/2015   Last Assessment & Plan:  Relevant Hx: Course: Daily Update: Today's Plan:update her level for her today she is taking her vitamin d daily now and will continue with this  Electronically signed by: Mayer Camel, NP 07/20/15 1232    Past Surgical History:  Procedure Laterality Date   BREAST BIOPSY Left 04/22/2016   BREAST LUMPECTOMY Left 05/2016   BREAST LUMPECTOMY WITH RADIOACTIVE SEED AND SENTINEL LYMPH NODE BIOPSY Left 05/02/2016   Procedure: BREAST LUMPECTOMY WITH RADIOACTIVE SEED AND SENTINEL LYMPH NODE BIOPSY;  Surgeon: Rolm Bookbinder, MD;  Location: [facility];  Service: General;  Laterality: Left;   COLONOSCOPY     POLYPECTOMY     TOTAL HIP ARTHROPLASTY Left 05/26/2019   Procedure: TOTAL HIP ARTHROPLASTY ANTERIOR APPROACH;  Surgeon: Gaynelle Arabian, MD;  Location: [facility];  Service: Orthopedics;  Laterality: Left;  141mn    Current Medications: Current Meds  Medication Sig   acetaminophen (TYLENOL)  325 MG tablet Take 325-650 mg by mouth every 6 (six) hours as needed (for pain.).    anastrozole (ARIMIDEX) 1 MG tablet TAKE 1 TABLET BY MOUTH EVERY EVENING.   aspirin EC 81 MG tablet Take 81 mg by mouth daily.   montelukast (SINGULAIR) 10 MG tablet Take 10 mg by mouth daily.    omeprazole (PRILOSEC) 20 MG capsule Take 20 mg by mouth daily.   PARoxetine (PAXIL) 40 MG tablet Take 40 mg by mouth every morning.     Allergies:   Latex, Levofloxacin, and Statins   Social History   Socioeconomic History   Marital status: Married    Spouse name: Not on file   Number of children: Not on file   Years of education: Not on file   Highest education level: Not on file  Occupational History   Not on file  Tobacco Use   Smoking status: Former  Types: Cigarettes    Quit date: 08/21/2012    Years since quitting: 8.9   Smokeless tobacco: Never  Vaping Use   Vaping Use: Former  Substance and Sexual Activity   Alcohol use: Yes    Comment: social   Drug use: No   Sexual activity: Not on file  Other Topics Concern   Not on file  Social History Narrative   Not on file   Social Determinants of Health   Financial Resource Strain: Not on file  Food Insecurity: Not on file  Transportation Needs: Not on file  Physical Activity: Not on file  Stress: Not on file  Social Connections: Not on file     Family History: The patient's family history includes Colon cancer in her mother; Hypertension in her sister; Stroke in her father. There is no history of Breast cancer, Colon polyps, Esophageal cancer, Rectal cancer, or Stomach cancer.  ROS:   Please see the history of present illness.    All other systems reviewed and are negative.  EKGs/Labs/Other Studies Reviewed:    The following studies were reviewed today: EKG reveals sinus rhythm and nonspecific ST-T changes and sinus tachycardia   Recent Labs: No results found for requested labs within last 365 days.  Recent Lipid Panel No  results found for: "CHOL", "TRIG", "HDL", "CHOLHDL", "VLDL", "LDLCALC", "LDLDIRECT"  Physical Exam:    VS:  BP 126/80   Pulse (!) 104   Ht 5' 5.6" (1.666 m)   Wt 210 lb 6.4 oz (95.4 kg)   SpO2 94%   BMI 34.37 kg/m     Wt Readings from Last 3 Encounters:  08/10/21 210 lb 6.4 oz (95.4 kg)  02/16/21 215 lb 4.8 oz (97.7 kg)  08/18/20 207 lb (93.9 kg)     GEN: Patient is in no acute distress HEENT: Normal NECK: No JVD; No carotid bruits LYMPHATICS: No lymphadenopathy CARDIAC: Hear sounds regular, 2/6 systolic murmur at the apex. RESPIRATORY:  Clear to auscultation without rales, wheezing or rhonchi  ABDOMEN: Soft, non-tender, non-distended MUSCULOSKELETAL:  No edema; No deformity  SKIN: Warm and dry NEUROLOGIC:  Alert and oriented x 3 PSYCHIATRIC:  Normal affect   Signed, Jenean Lindau, MD  08/10/2021 1:37 PM    [facility]

## 2021-08-16 NOTE — Progress Notes (Unsigned)
[facility]  [address] [place],  [place]  [address] [phone]  Clinic Day:  08/17/2020  Referring physician: Bess Harvest*  HISTORY OF PRESENT ILLNESS:  The patient is a 71 y.o. female with stage IA (T1b N0 M0) hormone positive breast cancer, status post a lumpectomy in March 2018.  She completed her adjuvant breast radiation in May 2018.  She takes anastrozole on a daily basis for the adjuvant endocrine management of her breast cancer.  She comes in today for routine follow up.  Since her last visit, the patient has been doing very well.  She denies having any particular changes in her breasts which concern her for disease recurrence.  Of note, her annual mammogram in May 2023 showed no evidence of disease recurrence.    PHYSICAL EXAM:  Blood pressure (!) 142/69, pulse (!) 104, temperature 98.4 F (36.9 C), resp. rate 16, height 5' 5.6" (1.666 m), weight 211 lb 11.2 oz (96 kg), SpO2 94 %. Wt Readings from Last 3 Encounters:  08/17/21 211 lb 11.2 oz (96 kg)  08/10/21 210 lb 6.4 oz (95.4 kg)  02/16/21 215 lb 4.8 oz (97.7 kg)   Body mass index is 34.59 kg/m. Performance status (ECOG): 0 - Asymptomatic Physical Exam Constitutional:      Appearance: Normal appearance.  HENT:     Mouth/Throat:     Pharynx: Oropharynx is clear. No oropharyngeal exudate.  Cardiovascular:     Rate and Rhythm: Normal rate and regular rhythm.     Heart sounds: No murmur heard.    No friction rub. No gallop.  Pulmonary:     Breath sounds: Normal breath sounds.  Chest:  Breasts:    Right: No swelling, bleeding, inverted nipple, mass, nipple discharge or skin change.     Left: No swelling, bleeding, inverted nipple, mass, nipple discharge or skin change.  Abdominal:     General: Bowel sounds are normal. There is no distension.     Palpations: Abdomen is soft. There is no mass.     Tenderness: There is no abdominal tenderness.  Musculoskeletal:         General: No tenderness.     Cervical back: Normal range of motion and neck supple.     Right lower leg: No edema.     Left lower leg: No edema.  Lymphadenopathy:     Cervical: No cervical adenopathy.     Right cervical: No superficial, deep or posterior cervical adenopathy.    Left cervical: No superficial, deep or posterior cervical adenopathy.     Upper Body:     Right upper body: No supraclavicular or axillary adenopathy.     Left upper body: No supraclavicular or axillary adenopathy.     Lower Body: No right inguinal adenopathy. No left inguinal adenopathy.  Skin:    Coloration: Skin is not jaundiced.     Findings: No lesion or rash.     Comments: Chronic, benign brown moles scattered diffusely over her torso  Neurological:     General: No focal deficit present.     Mental Status: She is alert and oriented to person, place, and time. Mental status is at baseline.  Psychiatric:        Mood and Affect: Mood normal.        Behavior: Behavior normal.        Thought Content: Thought content normal.        Judgment: Judgment normal.    ASSESSMENT & PLAN:  Assessment/Plan:  A 71 y.o. female with stage IA (T1b N0 M0) hormone positive breast cancer, status post a lumpectomy in March 2018.  She also completed adjuvant breast radiation in June 2018.  Based upon her clinical breast exam today and her recent mammogram, the patient remains disease-free.  Clinically, the patient continues to do very well.  She knows to continue taking her anastrozole on a daily basis, for which she is near completing her 5 total years of adjuvant endocrine therapy.  I do feel comfortable spacing all future visits out to once per year.  Her annual mammogram will be scheduled around the time of her her next visit for her continued radiographic breast cancer surveillance. The patient understands all the plans discussed today and is in agreement with them.    Melinda Isaza Macarthur Critchley, MD

## 2021-08-17 ENCOUNTER — Other Ambulatory Visit: Payer: Self-pay | Admitting: Oncology

## 2021-08-17 ENCOUNTER — Inpatient Hospital Stay: Payer: Medicare Other | Attending: Oncology | Admitting: Oncology

## 2021-08-17 VITALS — BP 142/69 | HR 104 | Temp 98.4°F | Resp 16 | Ht 65.6 in | Wt 211.7 lb

## 2021-08-17 DIAGNOSIS — Z1231 Encounter for screening mammogram for malignant neoplasm of breast: Secondary | ICD-10-CM

## 2021-08-17 DIAGNOSIS — Z170 Estrogen receptor positive status [ER+]: Secondary | ICD-10-CM

## 2021-08-17 DIAGNOSIS — C50412 Malignant neoplasm of upper-outer quadrant of left female breast: Secondary | ICD-10-CM | POA: Diagnosis not present

## 2021-08-21 ENCOUNTER — Ambulatory Visit
Admission: RE | Admit: 2021-08-21 | Discharge: 2021-08-21 | Disposition: A | Discharge status: Home / Self Care | Payer: Self-pay | Source: Ambulatory Visit | Attending: Cardiology | Admitting: Cardiology

## 2021-08-21 DIAGNOSIS — R9431 Abnormal electrocardiogram [ECG] [EKG]: Secondary | ICD-10-CM

## 2021-08-21 DIAGNOSIS — Z853 Personal history of malignant neoplasm of breast: Secondary | ICD-10-CM

## 2021-08-21 DIAGNOSIS — Z6834 Body mass index (BMI) 34.0-34.9, adult: Secondary | ICD-10-CM

## 2021-08-22 ENCOUNTER — Telehealth: Payer: Self-pay | Admitting: Cardiology

## 2021-08-22 DIAGNOSIS — E782 Mixed hyperlipidemia: Secondary | ICD-10-CM

## 2021-08-22 DIAGNOSIS — R931 Abnormal findings on diagnostic imaging of heart and coronary circulation: Secondary | ICD-10-CM

## 2021-08-22 MED ORDER — ROSUVASTATIN CALCIUM 10 MG PO TABS: 10.0000 mg | ORAL_TABLET | Freq: Every day | ORAL | 3 refills | Status: DC

## 2021-08-22 NOTE — Telephone Encounter (Signed)
Patient calling for CT results. 

## 2021-08-22 NOTE — Telephone Encounter (Signed)
Results reviewed with pt as per Dr. Revankar's note.  Pt verbalized understanding and had no additional questions. Routed to PCP.  

## 2021-08-23 ENCOUNTER — Telehealth: Payer: Self-pay | Admitting: Cardiology

## 2021-08-23 NOTE — Telephone Encounter (Signed)
Pt stated that she had tried Lipitor in the past and it affected her liver function tests. She is wanting to try diet and exercise on her own before starting medication.

## 2021-08-23 NOTE — Telephone Encounter (Signed)
New Message:    Patient says she would like to talk to [facility], concerning the Calcium test she had on Tuesday.

## 2021-10-02 LAB — LIPID PANEL
Chol/HDL Ratio: 5.2 ratio — ABNORMAL HIGH (ref 0.0–4.4)
Cholesterol, Total: 259 mg/dL — ABNORMAL HIGH (ref 100–199)
HDL: 50 mg/dL (ref >39)
LDL Chol Calc (NIH): 174 mg/dL — ABNORMAL HIGH (ref 0–99)
Triglycerides: 188 mg/dL — ABNORMAL HIGH (ref 0–149)
VLDL Cholesterol Cal: 35 mg/dL (ref 5–40)

## 2021-10-02 LAB — HEPATIC FUNCTION PANEL
ALT: 17 IU/L (ref 0–32)
AST: 17 IU/L (ref 0–40)
Albumin: 4.4 g/dL (ref 3.9–4.9)
Alkaline Phosphatase: 95 IU/L (ref 44–121)
Bilirubin Total: 0.4 mg/dL (ref 0.0–1.2)
Bilirubin, Direct: 0.12 mg/dL (ref 0.00–0.40)
Total Protein: 6.8 g/dL (ref 6.0–8.5)

## 2021-10-03 ENCOUNTER — Telehealth: Payer: Self-pay

## 2021-10-03 NOTE — Telephone Encounter (Signed)
-----   Message from Jenean Lindau, MD sent at 10/03/2021  2:03 PM EDT ----- Lipids are markedly elevated.  Diet.  Give low-cholesterol diet sheet.  Double statin and liver lipid check in 6 weeks.  Copy primary care Jenean Lindau, MD 10/03/2021 2:03 PM

## 2021-10-04 NOTE — Addendum Note (Signed)
Addended by: Truddie Hidden on: 10/04/2021 05:26 PM   Modules accepted: Orders

## 2022-02-13 ENCOUNTER — Other Ambulatory Visit: Payer: Self-pay | Admitting: Hematology and Oncology

## 2022-02-13 DIAGNOSIS — Z170 Estrogen receptor positive status [ER+]: Secondary | ICD-10-CM

## 2022-08-02 LAB — HM MAMMOGRAPHY

## 2022-08-18 NOTE — Progress Notes (Unsigned)
[facility]  [address] [place],  [place]  [address] [phone]  Clinic Day:  08/19/2022  Referring physician: Rhea Bleacher*  HISTORY OF PRESENT ILLNESS:  The patient is a 72 y.o. female with stage IA (T1b N0 M0) hormone positive breast cancer, status post a left breast lumpectomy in March 2018.  She completed her adjuvant breast radiation in May 2018.  She took anastrozole for 5 years of adjuvant endocrine therapy.  She comes in today for routine follow up.  Since her last visit, the patient has been doing very well.  Other than her left breast being smaller, she denies having any particular changes in her breasts which concern her for disease recurrence.  Of note, her annual mammogram in May 2024 showed no evidence of disease recurrence.  A bone density study done earlier this month showed persistent osteopenia.  PHYSICAL EXAM:  Blood pressure 135/69, pulse (!) 105, temperature 98.5 F (36.9 C), resp. rate 16, height 5' 5.6" (1.666 m), weight 200 lb (90.7 kg), SpO2 93 %. Wt Readings from Last 3 Encounters:  08/19/22 200 lb (90.7 kg)  08/17/21 211 lb 11.2 oz (96 kg)  08/10/21 210 lb 6.4 oz (95.4 kg)   Body mass index is 32.68 kg/m. Performance status (ECOG): 0 - Asymptomatic Physical Exam Constitutional:      Appearance: Normal appearance.  HENT:     Mouth/Throat:     Pharynx: Oropharynx is clear. No oropharyngeal exudate.  Cardiovascular:     Rate and Rhythm: Normal rate and regular rhythm.     Heart sounds: No murmur heard.    No friction rub. No gallop.  Pulmonary:     Breath sounds: Normal breath sounds.  Chest:  Breasts:    Right: No swelling, bleeding, inverted nipple, mass, nipple discharge or skin change.     Left: No swelling, bleeding, inverted nipple, mass, nipple discharge or skin change.  Abdominal:     General: Bowel sounds are normal. There is no distension.     Palpations: Abdomen is soft. There is no mass.      Tenderness: There is no abdominal tenderness.  Musculoskeletal:        General: No tenderness.     Cervical back: Normal range of motion and neck supple.     Right lower leg: No edema.     Left lower leg: No edema.  Lymphadenopathy:     Cervical: No cervical adenopathy.     Right cervical: No superficial, deep or posterior cervical adenopathy.    Left cervical: No superficial, deep or posterior cervical adenopathy.     Upper Body:     Right upper body: No supraclavicular or axillary adenopathy.     Left upper body: No supraclavicular or axillary adenopathy.     Lower Body: No right inguinal adenopathy. No left inguinal adenopathy.  Skin:    Coloration: Skin is not jaundiced.     Findings: No lesion or rash.     Comments: Chronic, benign brown moles scattered diffusely over her torso  Neurological:     General: No focal deficit present.     Mental Status: She is alert and oriented to person, place, and time. Mental status is at baseline.  Psychiatric:        Mood and Affect: Mood normal.        Behavior: Behavior normal.        Thought Content: Thought content normal.        Judgment:  Judgment normal.    ASSESSMENT & PLAN:  Assessment/Plan:  A 72 y.o. female with stage IA (T1b N0 M0) hormone positive breast cancer, status post a lumpectomy in March 2018.  She also completed adjuvant breast radiation in June 2018, as well as 5 years of adjuvant endocrine therapy.  Based upon her clinical breast exam today and her recent mammogram, the patient remains disease-free.  Clinically, the patient continues to do very well.  I will continue to follow her on an annual basis.  Her annual mammogram will be scheduled around the time of her next visit for her continued radiographic breast cancer surveillance. The patient understands all the plans discussed today and is in agreement with them.    Shruti Arrey Kirby Funk, MD

## 2022-08-19 ENCOUNTER — Other Ambulatory Visit: Payer: Self-pay | Admitting: Oncology

## 2022-08-19 ENCOUNTER — Inpatient Hospital Stay: Payer: Medicare Other | Attending: Oncology | Admitting: Oncology

## 2022-08-19 VITALS — BP 135/69 | HR 105 | Temp 98.5°F | Resp 16 | Ht 65.6 in | Wt 200.0 lb

## 2022-08-19 DIAGNOSIS — C50412 Malignant neoplasm of upper-outer quadrant of left female breast: Secondary | ICD-10-CM | POA: Diagnosis not present

## 2022-08-19 DIAGNOSIS — Z170 Estrogen receptor positive status [ER+]: Secondary | ICD-10-CM | POA: Diagnosis not present

## 2022-08-19 DIAGNOSIS — Z1231 Encounter for screening mammogram for malignant neoplasm of breast: Secondary | ICD-10-CM

## 2022-12-17 ENCOUNTER — Encounter: Payer: Self-pay | Admitting: Oncology

## 2023-04-10 ENCOUNTER — Other Ambulatory Visit: Payer: Self-pay

## 2023-04-10 MED ORDER — FLUCONAZOLE 100 MG PO TABS: ORAL_TABLET | ORAL | 0 refills | Status: DC

## 2023-08-18 NOTE — Progress Notes (Unsigned)
 [facility]  [address] [place],  [place]  [address] [phone]  Clinic Day:  08/19/2023  Referring physician: Edra Govern*  HISTORY OF PRESENT ILLNESS:  The patient is a 73 y.o. female with stage IA (T1b N0 M0) hormone positive breast cancer, status post a left breast lumpectomy in March 2018.  She completed her adjuvant breast radiation in May 2018.  She took anastrozole  for 5 years of adjuvant endocrine therapy.  She comes in today for routine follow up.  Since her last visit, the patient has been doing very well.  Other than her left breast getting progressively smaller, she denies having any particular changes in her breasts which concern her for disease recurrence.  Of note, her annual mammogram is scheduled for today.  PHYSICAL EXAM:  Blood pressure 122/75, pulse 70, temperature 98.8 F (37.1 C), temperature source Oral, resp. rate 16, height 5' 5.6 (1.666 m), weight 212 lb 8 oz (96.4 kg), SpO2 97%. Wt Readings from Last 3 Encounters:  08/19/23 212 lb 8 oz (96.4 kg)  08/19/22 200 lb (90.7 kg)  08/17/21 211 lb 11.2 oz (96 kg)   Body mass index is 34.72 kg/m. Performance status (ECOG): 0 - Asymptomatic Physical Exam Constitutional:      Appearance: Normal appearance.  HENT:     Mouth/Throat:     Pharynx: Oropharynx is clear. No oropharyngeal exudate.   Cardiovascular:     Rate and Rhythm: Normal rate and regular rhythm.     Heart sounds: No murmur heard.    No friction rub. No gallop.  Pulmonary:     Breath sounds: Normal breath sounds.  Chest:  Breasts:    Right: No swelling, bleeding, inverted nipple, mass, nipple discharge or skin change.     Left: No swelling, bleeding, inverted nipple, mass, nipple discharge or skin change.     Comments: Left breast is 15-20% smaller than right breast Abdominal:     General: Bowel sounds are normal. There is no distension.     Palpations: Abdomen is soft. There is no mass.      Tenderness: There is no abdominal tenderness.   Musculoskeletal:        General: No tenderness.     Cervical back: Normal range of motion and neck supple.     Right lower leg: No edema.     Left lower leg: No edema.  Lymphadenopathy:     Cervical: No cervical adenopathy.     Right cervical: No superficial, deep or posterior cervical adenopathy.    Left cervical: No superficial, deep or posterior cervical adenopathy.     Upper Body:     Right upper body: No supraclavicular or axillary adenopathy.     Left upper body: No supraclavicular or axillary adenopathy.     Lower Body: No right inguinal adenopathy. No left inguinal adenopathy.   Skin:    Coloration: Skin is not jaundiced.     Findings: No lesion or rash.     Comments: Chronic, benign brown moles scattered diffusely over her torso   Neurological:     General: No focal deficit present.     Mental Status: She is alert and oriented to person, place, and time. Mental status is at baseline.   Psychiatric:        Mood and Affect: Mood normal.        Behavior: Behavior normal.        Thought Content: Thought content normal.  Judgment: Judgment normal.    ASSESSMENT & PLAN:  Assessment/Plan:  A 73 y.o. female with stage IA (T1b N0 M0) hormone positive breast cancer, status post a lumpectomy in March 2018.  She also completed adjuvant breast radiation in June 2018, as well as 5 years of adjuvant endocrine therapy.  Based upon her clinical breast exam today, the patient remains disease-free.  Clinically, the patient continues to do very well.  I will continue to follow her on an annual basis.  Her annual mammogram for 2025 is happening today; her next one will be scheduled around the time of her next visit in one year for her continued radiographic breast cancer surveillance. The patient understands all the plans discussed today and is in agreement with them.    Itati Brocksmith Felicia Horde, MD

## 2023-08-19 ENCOUNTER — Encounter (HOSPITAL_BASED_OUTPATIENT_CLINIC_OR_DEPARTMENT_OTHER): Payer: Self-pay | Admitting: Radiology

## 2023-08-19 ENCOUNTER — Other Ambulatory Visit: Payer: Self-pay

## 2023-08-19 ENCOUNTER — Inpatient Hospital Stay: Payer: Medicare Other | Attending: Oncology | Admitting: Oncology

## 2023-08-19 ENCOUNTER — Other Ambulatory Visit: Payer: Self-pay | Admitting: Oncology

## 2023-08-19 ENCOUNTER — Ambulatory Visit (HOSPITAL_BASED_OUTPATIENT_CLINIC_OR_DEPARTMENT_OTHER)
Admission: RE | Admit: 2023-08-19 | Discharge: 2023-08-19 | Disposition: A | Discharge status: Home / Self Care | Source: Ambulatory Visit | Attending: Oncology | Admitting: Oncology

## 2023-08-19 VITALS — BP 122/75 | HR 70 | Temp 98.8°F | Resp 16 | Ht 65.6 in | Wt 212.5 lb

## 2023-08-19 DIAGNOSIS — C50412 Malignant neoplasm of upper-outer quadrant of left female breast: Secondary | ICD-10-CM

## 2023-08-19 DIAGNOSIS — Z1231 Encounter for screening mammogram for malignant neoplasm of breast: Secondary | ICD-10-CM | POA: Diagnosis not present

## 2023-08-19 DIAGNOSIS — Z923 Personal history of irradiation: Secondary | ICD-10-CM | POA: Insufficient documentation

## 2023-08-19 DIAGNOSIS — Z170 Estrogen receptor positive status [ER+]: Secondary | ICD-10-CM

## 2023-08-19 DIAGNOSIS — Z853 Personal history of malignant neoplasm of breast: Secondary | ICD-10-CM | POA: Insufficient documentation

## 2024-02-16 ENCOUNTER — Ambulatory Visit (HOSPITAL_BASED_OUTPATIENT_CLINIC_OR_DEPARTMENT_OTHER): Admit: 2024-02-16 | Discharge: 2024-02-16 | Disposition: A | Admitting: Radiology

## 2024-02-16 ENCOUNTER — Ambulatory Visit (HOSPITAL_BASED_OUTPATIENT_CLINIC_OR_DEPARTMENT_OTHER)
Admission: EM | Admit: 2024-02-16 | Discharge: 2024-02-16 | Disposition: A | Discharge status: Home / Self Care | Attending: Family Medicine | Admitting: Family Medicine

## 2024-02-16 ENCOUNTER — Other Ambulatory Visit: Payer: Self-pay

## 2024-02-16 ENCOUNTER — Encounter (HOSPITAL_BASED_OUTPATIENT_CLINIC_OR_DEPARTMENT_OTHER): Payer: Self-pay

## 2024-02-16 ENCOUNTER — Other Ambulatory Visit (HOSPITAL_BASED_OUTPATIENT_CLINIC_OR_DEPARTMENT_OTHER): Payer: Self-pay

## 2024-02-16 DIAGNOSIS — R9431 Abnormal electrocardiogram [ECG] [EKG]: Secondary | ICD-10-CM

## 2024-02-16 DIAGNOSIS — R0602 Shortness of breath: Secondary | ICD-10-CM

## 2024-02-16 DIAGNOSIS — R000 Tachycardia, unspecified: Secondary | ICD-10-CM

## 2024-02-16 LAB — POC COVID19/FLU A&B COMBO
Covid Antigen, POC: NEGATIVE
Influenza A Antigen, POC: NEGATIVE
Influenza B Antigen, POC: NEGATIVE

## 2024-02-16 MED ORDER — AMOXICILLIN-POT CLAVULANATE 875-125 MG PO TABS
1.0000 | ORAL_TABLET | Freq: Two times a day (BID) | ORAL | 0 refills | Status: DC
  Filled 2024-02-16: qty 14, 7d supply, fill #0

## 2024-02-16 MED ORDER — IPRATROPIUM-ALBUTEROL 0.5-2.5 (3) MG/3ML IN SOLN: 3.0000 mL | Freq: Once | RESPIRATORY_TRACT | Status: DC

## 2024-02-16 MED ORDER — IPRATROPIUM-ALBUTEROL 0.5-2.5 (3) MG/3ML IN SOLN
3.0000 mL | Freq: Once | RESPIRATORY_TRACT | Status: AC
  Administered 2024-02-16: 17:00:00 3 mL via RESPIRATORY_TRACT

## 2024-02-16 MED ORDER — ALBUTEROL SULFATE HFA 108 (90 BASE) MCG/ACT IN AERS
1.0000 | INHALATION_SPRAY | Freq: Four times a day (QID) | RESPIRATORY_TRACT | 0 refills | Status: DC | PRN
  Filled 2024-02-16: qty 6.7, 25d supply, fill #0

## 2024-02-16 MED ORDER — PREDNISONE 20 MG PO TABS
40.0000 mg | ORAL_TABLET | Freq: Every day | ORAL | 0 refills | Status: AC
  Filled 2024-02-16: qty 10, 5d supply, fill #0

## 2024-02-16 NOTE — Discharge Instructions (Addendum)
 Your chest x-ray did not show any pneumonia or infection.  COVID and flu testing were negative.  This could be an asthma exacerbation from some sort of virus versus something you were exposed to.  Breathing treatment given here today.  I am prescribing prednisone  to take daily over the next 5 days and an inhaler to use as needed.  EKG did have some slight abnormalities and would recommend following up with your cardiologist for recheck.  If the shortness of breath worsens or you start developing chest pain you will need to go straight to the ER.

## 2024-02-16 NOTE — ED Triage Notes (Signed)
 Patient reports initially was treated for sinus infection mid November. Treated with zpack. States since that time, has started to develop shortness of breath. Initially only during exertion and now having standing at sink doing dishes or sitting. No lower extremity swelling. No fevers. A little cough. Patient has asthma and has been using her inhaler more recently.

## 2024-02-16 NOTE — ED Provider Notes (Signed)
 [facility]    CSN: [id number] Arrival date & time: 02/16/24  1446      History   Chief Complaint Chief Complaint  Patient presents with   Shortness of Breath   Cough    HPI Melinda Wolfe is a 73 y.o. female.   Patient is a 73 year old female presents today with cough, shortness of breath.  Patient reports initially was treated for sinus infection mid November. Treated with zpack. States since that time, has started to develop shortness of breath.  Shortness of breath is mostly during exertion.  She reports that all the symptoms started after being exposed to some dust during a renovation at home.  She does have a history of asthma.  Currently takes Singulair.  Uses some kind of over-the-counter inhaler as needed.  Denies any chest pain.  Denies any fevers, chills, body aches, night sweats.  Denies any lower extremity swelling.   Shortness of Breath Associated symptoms: cough   Cough Associated symptoms: shortness of breath     Past Medical History:  Diagnosis Date   Abnormal CXR 03/30/2016   Abnormal EKG 03/18/2019   Anxiety 07/19/2015   Last Assessment & Plan:  Relevant Hx: Course: Daily Update: Today's Plan:this is stable for her at this time and will follow on her current dose of meds  Electronically signed by: Eleanor Merlynn Lady, NP 07/20/15 1230   Asthma    uses an inhaler   ASTHMA 11/11/2007   Qualifier: Diagnosis of  By: Latisha CMA, Leigh     Biliary dyskinesia 11/23/2019   BMI 34.0-34.9,adult 11/23/2019   BMI 35.0-35.9,adult 11/23/2019   Breast cancer (HCC) 2018   Left Breast Cancer/ 22 radiation treatments   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   Complication of anesthesia    per pt,hard to wake up after sedation!   Depression    Gastroesophageal reflux disease without esophagitis 07/20/2015   Last Assessment & Plan:  Relevant Hx: Course: Daily Update: Today's Plan:she has been stable on the PPI  But  she is trying to wean this down to qod which is fine for her to do  Electronically signed by: Eleanor Merlynn Lady, NP 07/20/15 1235   GERD (gastroesophageal reflux disease)    History of basal cell cancer 04/06/2020   Formatting of this note might be different from the original. Following closely with dermatology   History of breast cancer 08/15/2016   History of COVID-19 05/12/2019   Hypercalcemia 07/19/2015   Malaise and fatigue 07/19/2015   Last Assessment & Plan:  Relevant Hx: Course: Daily Update: Today's Plan:this is off and on for her and mainly with her work that is very stressful for her despite being there 30 years it is still quite hard on her  Electronically signed by: Eleanor Merlynn Lady, NP 07/20/15 1231   Mild intermittent asthma without complication 07/19/2015   Last Assessment & Plan:  Relevant Hx: Course: Daily Update: Today's Plan:this is stable for her at this time and she has inhaler to use if she needs to but she has not had to in some time  Electronically signed by: Eleanor Merlynn Lady, NP 07/20/15 1230   Mixed hyperlipidemia 07/19/2015   Last Assessment & Plan:  Relevant Hx: Course: Daily Update: Today's Plan:she is going to have her lipids checked she cannot take statins due to the fact that she has such severe myalagias with them  Electronically signed by: Eleanor Merlynn Lady, NP 07/20/15 1231  Osteoarthritis of hip 12/30/2018   Personal history of radiation therapy 2018   Left Breast Cancer/ 22 treatments   Post-operative state 07/08/2019   Postoperative examination 12/20/2019   Prediabetes 11/07/2017   Recurrent major depressive disorder 07/19/2015   Last Assessment & Plan:  Relevant Hx: Course: Daily Update: Today's Plan:she feels this is stable for her and will follow  Electronically signed by: Eleanor Merlynn Lady, NP 07/20/15 1231   Special screening examination for viral disease 11/23/2019   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   Qualifier: Diagnosis of  By:  Latisha CMA, Leigh     Vitamin D deficiency 07/19/2015   Last Assessment & Plan:  Relevant Hx: Course: Daily Update: Today's Plan:update her level for her today she is taking her vitamin d daily now and will continue with this  Electronically signed by: Eleanor Merlynn Lady, NP 07/20/15 1232    Patient Active Problem List   Diagnosis Date Noted   GERD (gastroesophageal reflux disease)    Depression    Complication of anesthesia    Asthma    History of basal cell cancer 04/06/2020   Postoperative examination 12/20/2019   Biliary dyskinesia 11/23/2019   BMI 34.0-34.9,adult 11/23/2019   Special screening examination for viral disease 11/23/2019   BMI 35.0-35.9,adult 11/23/2019   Post-operative state 07/08/2019   History of COVID-19 05/12/2019   Abnormal EKG 03/18/2019   Osteoarthritis of hip 12/30/2018   Prediabetes 11/07/2017   History of breast cancer 08/15/2016   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   Abnormal CXR 03/30/2016   Personal history of radiation therapy 2018   Breast cancer (HCC) 2018   Gastroesophageal reflux disease without esophagitis 07/20/2015   Anxiety 07/19/2015   Hypercalcemia 07/19/2015   Malaise and fatigue 07/19/2015   Mild intermittent asthma without complication 07/19/2015   Mixed hyperlipidemia 07/19/2015   Recurrent major depressive disorder 07/19/2015   Vitamin D deficiency 07/19/2015   ASTHMA 11/11/2007   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    Past Surgical History:  Procedure Laterality Date   BREAST BIOPSY Left 04/22/2016   BREAST LUMPECTOMY Left 05/2016   BREAST LUMPECTOMY WITH RADIOACTIVE SEED AND SENTINEL LYMPH NODE BIOPSY Left 05/02/2016   Procedure: BREAST LUMPECTOMY WITH RADIOACTIVE SEED AND SENTINEL LYMPH NODE BIOPSY;  Surgeon: Donnice Bury, MD;  Location: [facility];  Service: General;  Laterality: Left;   COLONOSCOPY     POLYPECTOMY     TOTAL HIP ARTHROPLASTY Left 05/26/2019    Procedure: TOTAL HIP ARTHROPLASTY ANTERIOR APPROACH;  Surgeon: Melodi Lerner, MD;  Location: [facility];  Service: Orthopedics;  Laterality: Left;     OB History   No obstetric history on file.      Home Medications    Prior to Admission medications  Medication Sig Start Date End Date Taking? Authorizing Provider  albuterol  (VENTOLIN  HFA) 108 (90 Base) MCG/ACT inhaler Inhale 1-2 puffs into the lungs every 6 (six) hours as needed for wheezing or shortness of breath. 02/16/24  Yes Missy Baksh A, FNP  amoxicillin -clavulanate (AUGMENTIN ) 875-125 MG tablet Take 1 tablet by mouth every 12 (twelve) hours. 02/16/24  Yes Taija Mathias A, FNP  predniSONE  (DELTASONE ) 20 MG tablet Take 2 tablets (40 mg total) by mouth daily with breakfast for 5 days. 02/16/24 02/21/24 Yes Seleste Tallman A, FNP  acetaminophen  (TYLENOL ) 325 MG tablet Take 325-650 mg by mouth every 6 (six) hours as needed (for pain.).     [provider]  anastrozole  (ARIMIDEX ) 1 MG tablet TAKE 1  TABLET BY MOUTH EVERY EVENING. 02/14/21   Parsons, Melissa A, NP  aspirin EC 81 MG tablet Take 81 mg by mouth daily.    [provider]  montelukast (SINGULAIR) 10 MG tablet Take 10 mg by mouth daily.     [provider]  nystatin  (MYCOSTATIN /NYSTOP ) powder Apply 1 application. topically 3 (three) times daily. 05/16/21   Parsons, Melissa A, NP  omeprazole (PRILOSEC) 20 MG capsule Take 20 mg by mouth daily.    [provider]  PARoxetine (PAXIL) 40 MG tablet Take 40 mg by mouth every morning.    [provider]    Family History Family History  Problem Relation Age of Onset   Colon cancer Mother    Stroke Father    Hypertension Sister    Breast cancer Neg Hx    Colon polyps Neg Hx    Esophageal cancer Neg Hx    Rectal cancer Neg Hx    Stomach cancer Neg Hx     Social History Social History[1]   Allergies   Latex, Levofloxacin, and Statins   Review of Systems Review of Systems   Respiratory:  Positive for cough and shortness of breath.      Physical Exam Triage Vital Signs ED Triage Vitals  Encounter Vitals Group     BP 02/16/24 1516 117/74     Girls Systolic BP Percentile --      Girls Diastolic BP Percentile --      Boys Systolic BP Percentile --      Boys Diastolic BP Percentile --      Pulse Rate 02/16/24 1516 (!) 113     Resp 02/16/24 1516 20     Temp 02/16/24 1516 98.3 F (36.8 C)     Temp Source 02/16/24 1516 Oral     SpO2 02/16/24 1516 92 %     Weight --      Height --      Head Circumference --      Peak Flow --      Pain Score 02/16/24 1519 0     Pain Loc --      Pain Education --      Exclude from Growth Chart --    No data found.  Updated Vital Signs BP 117/74 (BP Location: Right Arm)   Pulse (!) 113   Temp 98.3 F (36.8 C) (Oral)   Resp 20   SpO2 93%   Visual Acuity Right Eye Distance:   Left Eye Distance:   Bilateral Distance:    Right Eye Near:   Left Eye Near:    Bilateral Near:     Physical Exam Vitals and nursing note reviewed.  Constitutional:      General: She is not in acute distress.    Appearance: Normal appearance. She is not ill-appearing, toxic-appearing or diaphoretic.  HENT:     Nose: Nose normal.  Eyes:     Conjunctiva/sclera: Conjunctivae normal.  Cardiovascular:     Rate and Rhythm: Regular rhythm. Tachycardia present.  Pulmonary:     Effort: Pulmonary effort is normal.     Breath sounds: Examination of the right-upper field reveals decreased breath sounds. Examination of the left-upper field reveals decreased breath sounds. Examination of the right-middle field reveals decreased breath sounds. Examination of the left-middle field reveals decreased breath sounds. Examination of the right-lower field reveals decreased breath sounds. Examination of the left-lower field reveals decreased breath sounds. Decreased breath sounds present.  Musculoskeletal:  General: Normal range of motion.   Skin:    General: Skin is warm and dry.  Neurological:     Mental Status: She is alert.  Psychiatric:        Mood and Affect: Mood normal.      UC Treatments / Results  Labs (all labs ordered are listed, but only abnormal results are displayed) Labs Reviewed  POC COVID19/FLU A&B COMBO - Normal    EKG   Radiology DG Chest 2 View Result Date: 02/16/2024 EXAM: 2 VIEW(S) XRAY OF THE CHEST 02/16/2024 03:53:34 PM COMPARISON: Chest x-ray dated 07/08/2019. CLINICAL HISTORY: SOB FINDINGS: LUNGS AND PLEURA: No focal pulmonary opacity. No pleural effusion. No pneumothorax. HEART AND MEDIASTINUM: No acute abnormality of the cardiac and mediastinal silhouettes. BONES AND SOFT TISSUES: Thoracic spine spondylosis. IMPRESSION: 1. No acute cardiopulmonary process. Electronically signed by: Greig Pique MD 02/16/2024 04:00 PM EST RP Workstation: [id number]    Procedures Procedures (including critical care time)  Medications Ordered in UC Medications  ipratropium-albuterol  (DUONEB) 0.5-2.5 (3) MG/3ML nebulizer solution 3 mL (3 mLs Nebulization Given 02/16/24 1657)    Initial Impression / Assessment and Plan / UC Course  I have reviewed the triage vital signs and the nursing notes.  Pertinent labs & imaging results that were available during my care of the patient were reviewed by me and considered in my medical decision making (see chart for details).     SOB with tachycardia- EKG with sinus tachycardia and BBB. EKG compared to previous done in 2023 and similar. No concerns at this time for ACS. She also had calcium  score at that time. She is not having any chest pain or LE swelling.  The shortness of breath started after having some renovation done inside of her house and inhaling some of the dust.  Since she has had some wheezing and shortness of breath.  She has been using over-the-counter inhaler with some relief.  Oxygen saturations on the lower end here around 91 to 92%.  She is not in  any distress. Chest x-ray normal without any concerns for pneumonia.  COVID and flu test negative. Believe she is having a mild asthma exacerbation.  Does have some decreased lung sounds and mild wheezing on exam.  DuoNeb given here in clinic with some relief of symptoms.  Lung sounds were clear. I am prescribing prednisone  to take daily for the next 5 days.  I have prescribed an albuterol  inhaler to use as needed for cough, wheezing or shortness of breath.  Will go ahead and treat her for potential infection at this time.  X-ray does appear to be slightly hazy.  She may also have a sinus infection.  Recommend if symptoms do not improve or the shortness of breath worsens she will need to go to the ER.  Otherwise recommended to follow-up with cardiology for a checkup. Final Clinical Impressions(s) / UC Diagnoses   Final diagnoses:  SOB (shortness of breath)  Tachycardia  Nonspecific abnormal electrocardiogram (ECG) (EKG)     Discharge Instructions      Your chest x-ray did not show any pneumonia or infection.  COVID and flu testing were negative.  This could be an asthma exacerbation from some sort of virus versus something you were exposed to.  Breathing treatment given here today.  I am prescribing prednisone  to take daily over the next 5 days and an inhaler to use as needed.  EKG did have some slight abnormalities and would recommend following up with your cardiologist  for recheck.  If the shortness of breath worsens or you start developing chest pain you will need to go straight to the ER.    ED Prescriptions     Medication Sig Dispense Auth. Provider   albuterol  (VENTOLIN  HFA) 108 (90 Base) MCG/ACT inhaler Inhale 1-2 puffs into the lungs every 6 (six) hours as needed for wheezing or shortness of breath. 6.7 g Alistair Senft A, FNP   predniSONE  (DELTASONE ) 20 MG tablet Take 2 tablets (40 mg total) by mouth daily with breakfast for 5 days. 10 tablet Lyra Alaimo A, FNP    amoxicillin -clavulanate (AUGMENTIN ) 875-125 MG tablet Take 1 tablet by mouth every 12 (twelve) hours. 14 tablet Adah Wilbert LABOR, FNP      PDMP not reviewed this encounter.     [1]  Social History Tobacco Use   Smoking status: Former    Current packs/day: 0.00    Types: Cigarettes    Quit date: 08/21/2012    Years since quitting: 11.4   Smokeless tobacco: Never  Vaping Use   Vaping status: Former  Substance Use Topics   Alcohol use: Yes    Comment: social   Drug use: No     Adah Wilbert LABOR, FNP 02/17/24 [phone]

## 2024-03-01 ENCOUNTER — Ambulatory Visit: Attending: Cardiology | Admitting: Cardiology

## 2024-03-01 ENCOUNTER — Encounter: Payer: Self-pay | Admitting: Cardiology

## 2024-03-01 VITALS — BP 128/76 | HR 100 | Ht 65.0 in | Wt 208.2 lb

## 2024-03-01 DIAGNOSIS — I209 Angina pectoris, unspecified: Secondary | ICD-10-CM | POA: Insufficient documentation

## 2024-03-01 DIAGNOSIS — I700 Atherosclerosis of aorta: Secondary | ICD-10-CM

## 2024-03-01 DIAGNOSIS — I259 Chronic ischemic heart disease, unspecified: Secondary | ICD-10-CM | POA: Diagnosis not present

## 2024-03-01 DIAGNOSIS — E7849 Other hyperlipidemia: Secondary | ICD-10-CM | POA: Insufficient documentation

## 2024-03-01 DIAGNOSIS — R011 Cardiac murmur, unspecified: Secondary | ICD-10-CM

## 2024-03-01 DIAGNOSIS — R931 Abnormal findings on diagnostic imaging of heart and coronary circulation: Secondary | ICD-10-CM

## 2024-03-01 DIAGNOSIS — Z6834 Body mass index (BMI) 34.0-34.9, adult: Secondary | ICD-10-CM | POA: Diagnosis not present

## 2024-03-01 HISTORY — DX: Abnormal findings on diagnostic imaging of heart and coronary circulation: R93.1

## 2024-03-01 HISTORY — DX: Cardiac murmur, unspecified: R01.1

## 2024-03-01 HISTORY — DX: Angina pectoris, unspecified: I20.9

## 2024-03-01 HISTORY — DX: Other hyperlipidemia: E78.49

## 2024-03-01 HISTORY — DX: Atherosclerosis of aorta: I70.0

## 2024-03-01 MED ORDER — METOPROLOL TARTRATE 100 MG PO TABS: 100.0000 mg | ORAL_TABLET | Freq: Once | ORAL | 0 refills | Status: DC

## 2024-03-01 MED ORDER — NITROGLYCERIN 0.4 MG SL SUBL: 0.4000 mg | SUBLINGUAL_TABLET | SUBLINGUAL | 6 refills | Status: AC | PRN

## 2024-03-01 MED ORDER — PITAVASTATIN CALCIUM 2 MG PO TABS: 2.0000 mg | ORAL_TABLET | Freq: Every day | ORAL | 12 refills | Status: DC

## 2024-03-01 NOTE — Progress Notes (Signed)
 " Cardiology Office Note:    Date:  03/01/2024   ID:  Melinda Wolfe, DOB 12-25-1950, MRN [id number]  PCP:  Benson Eleanor Rung, NP  Cardiologist:  Jennifer JONELLE Crape, MD   Referring MD: Benson Eleanor Rung, NP    ASSESSMENT:    1. Aortic atherosclerosis   2. Elevated coronary artery calcium  score   3. BMI 34.0-34.9,adult   4. Familial hyperlipidemia   5. Angina pectoris   6. Cardiac murmur    PLAN:    In order of problems listed above:  Elevated calcium  score: Chest pain: Angina pectoris: Symptoms are concerning.  Sublingual nitroglycerin  prescription was sent in this protocol explained.  In view of her significant symptoms I discussed modalities of evaluation and she prefers CT coronary angiography with FFR.  She also has bronchial asthma which complicates the issue with evaluating with medications such as Lexiscan.  Her asthma is better at this time. Essential hypertension: Blood pressure stable and diet was emphasized. Mixed dyslipidemia: Not on lipid-lowering medications.  Diet emphasized.  Recent LFTs are fine and therefore I initiated pitavastatin 2 mg daily.  She will let me know how she feels.  She will be seen in follow-up appointment in 5 to 6 weeks and before her visit she will do blood work for fasting labs. Cardiac murmur: Echocardiogram will be done to assess murmur heard on auscultation. Obesity: Weight reduction stressed diet emphasized and she promises to do better.  Risks of obesity explained.   Medication Adjustments/Labs and Tests Ordered: Current medicines are reviewed at length with the patient today.  Concerns regarding medicines are outlined above.  No orders of the defined types were placed in this encounter.  No orders of the defined types were placed in this encounter.    No chief complaint on file.    History of Present Illness:    Melinda Wolfe is a 73 y.o. female.  Patient has past medical history of elevated  calcium  score, mixed dyslipidemia with markedly elevated LDL and obesity.  She leads a sedentary lifestyle.  She mentions to me that she went to the emergency room with dyspnea on exertion and was treated and released.  She has had a bathroom remodel shop at her house and since then this problem started.  She also has some chest tightness at times.  Unfortunately she did not follow-up from previous previous visit with me and did not want statins or any such therapy or diet for her markedly elevated lipids.  She tells me that she tried atorvastatin with some significant side effects.  At the time of my evaluation, the patient is alert awake oriented and in no distress.  Past Medical History:  Diagnosis Date   Abnormal CXR 03/30/2016   Abnormal EKG 03/18/2019   Anxiety 07/19/2015   Last Assessment & Plan:  Relevant Hx: Course: Daily Update: Today's Plan:this is stable for her at this time and will follow on her current dose of meds  Electronically signed by: Eleanor Rung Benson, NP 07/20/15 1230   Asthma    uses an inhaler   Biliary dyskinesia 11/23/2019   BMI 34.0-34.9,adult 11/23/2019   BMI 35.0-35.9,adult 11/23/2019   Breast cancer (HCC) 2018   Left Breast Cancer/ 22 radiation treatments   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   Complication of anesthesia    per pt,hard to wake up after sedation!   Depression    Gastroesophageal reflux disease without esophagitis 07/20/2015  Last Assessment & Plan:  Relevant Hx: Course: Daily Update: Today's Plan:she has been stable on the PPI  But she is trying to wean this down to qod which is fine for her to do  Electronically signed by: Eleanor Merlynn Lady, NP 07/20/15 1235   GERD (gastroesophageal reflux disease)    History of basal cell cancer 04/06/2020   Formatting of this note might be different from the original. Following closely with dermatology   History of breast cancer 08/15/2016   History  of COVID-19 05/12/2019   Hypercalcemia 07/19/2015   Malaise and fatigue 07/19/2015   Last Assessment & Plan:  Relevant Hx: Course: Daily Update: Today's Plan:this is off and on for her and mainly with her work that is very stressful for her despite being there 30 years it is still quite hard on her  Electronically signed by: Eleanor Merlynn Lady, NP 07/20/15 1231   Mild intermittent asthma without complication 07/19/2015   Last Assessment & Plan:  Relevant Hx: Course: Daily Update: Today's Plan:this is stable for her at this time and she has inhaler to use if she needs to but she has not had to in some time  Electronically signed by: Eleanor Merlynn Lady, NP 07/20/15 1230   Mixed hyperlipidemia 07/19/2015   Last Assessment & Plan:  Relevant Hx: Course: Daily Update: Today's Plan:she is going to have her lipids checked she cannot take statins due to the fact that she has such severe myalagias with them  Electronically signed by: Eleanor Merlynn Lady, NP 07/20/15 1231   Osteoarthritis of hip 12/30/2018   Personal history of radiation therapy 2018   Left Breast Cancer/ 22 treatments   Post-operative state 07/08/2019   Postoperative examination 12/20/2019   Prediabetes 11/07/2017   Recurrent major depressive disorder 07/19/2015   Last Assessment & Plan:  Relevant Hx: Course: Daily Update: Today's Plan:she feels this is stable for her and will follow  Electronically signed by: Eleanor Merlynn Lady, NP 07/20/15 1231   Special screening examination for viral disease 11/23/2019   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   Qualifier: Diagnosis of  By: Latisha CMA, Leigh     Vitamin D deficiency 07/19/2015   Last Assessment & Plan:  Relevant Hx: Course: Daily Update: Today's Plan:update her level for her today she is taking her vitamin d daily now and will continue with this  Electronically signed by: Eleanor Merlynn Lady, NP 07/20/15 1232    Past Surgical History:  Procedure Laterality Date    BREAST BIOPSY Left 04/22/2016   BREAST LUMPECTOMY Left 05/2016   BREAST LUMPECTOMY WITH RADIOACTIVE SEED AND SENTINEL LYMPH NODE BIOPSY Left 05/02/2016   Procedure: BREAST LUMPECTOMY WITH RADIOACTIVE SEED AND SENTINEL LYMPH NODE BIOPSY;  Surgeon: Donnice Bury, MD;  Location: [facility];  Service: General;  Laterality: Left;   COLONOSCOPY     POLYPECTOMY     TOTAL HIP ARTHROPLASTY Left 05/26/2019   Procedure: TOTAL HIP ARTHROPLASTY ANTERIOR APPROACH;  Surgeon: Melodi Lerner, MD;  Location: [facility];  Service: Orthopedics;  Laterality: Left;     Current Medications: Active Medications[1]   Allergies:   Latex, Levofloxacin, and Statins   Social History   Socioeconomic History   Marital status: Married    Spouse name: Not on file   Number of children: Not on file   Years of education: Not on file   Highest education level: Not on file  Occupational History   Not on file  Tobacco Use   Smoking status: Former  Current packs/day: 0.00    Types: Cigarettes    Quit date: 08/21/2012    Years since quitting: 11.5   Smokeless tobacco: Never  Vaping Use   Vaping status: Former  Substance and Sexual Activity   Alcohol use: Yes    Comment: social   Drug use: No   Sexual activity: Not on file  Other Topics Concern   Not on file  Social History Narrative   Not on file   Social Drivers of Health   Tobacco Use: Medium Risk (03/01/2024)   Patient History    Smoking Tobacco Use: Former    Smokeless Tobacco Use: Never    Passive Exposure: Not on Actuary Strain: Not on file  Food Insecurity: Low Risk (10/07/2023)   Received from [facility]   Epic    Within the past 12 months, you worried that your food would run out before you got money to buy more: Never true    Within the past 12 months, the food you bought just didn't last and you didn't have money to get more. : Never true  Transportation Needs: No Transportation Needs (10/07/2023)    Received from [facility]    In the past 12 months, has lack of reliable transportation kept you from medical appointments, meetings, work or from getting things needed for daily living? : No  Physical Activity: Not on file  Stress: Not on file  Social Connections: Not on file  Depression (EYV7-0): Not on file  Alcohol Screen: Not on file  Housing: Low Risk (10/07/2023)   Received from [facility]   Epic    What is your living situation today?: I have a steady place to live    Think about the place you live. Do you have problems with any of the following? Choose all that apply:: None/None on this list  Utilities: Low Risk (10/07/2023)   Received from [facility]   Utilities    In the past 12 months has the electric, gas, oil, or water  company threatened to shut off services in your home? : No  Health Literacy: Not on file     Family History: The patient's family history includes Colon cancer in her mother; Hypertension in her sister; Stroke in her father. There is no history of Breast cancer, Colon polyps, Esophageal cancer, Rectal cancer, or Stomach cancer.  ROS:   Please see the history of present illness.    All other systems reviewed and are negative.  EKGs/Labs/Other Studies Reviewed:    The following studies were reviewed today: .SABRA   I discussed my findings with the patient at length EKGs and ER visit labs were reviewed   Recent Labs: No results found for requested labs within last 365 days.  Recent Lipid Panel    Component Value Date/Time   CHOL 259 (H) 10/02/2021 0950   TRIG 188 (H) 10/02/2021 0950   HDL 50 10/02/2021 0950   CHOLHDL 5.2 (H) 10/02/2021 0950   LDLCALC 174 (H) 10/02/2021 0950    Physical Exam:    VS:  BP 128/76   Pulse 100   Ht 5' 5 (1.651 m)   Wt 208 lb 3.2 oz (94.4 kg)   SpO2 92%   BMI 34.65 kg/m     Wt Readings from Last 3 Encounters:  03/01/24 208 lb 3.2 oz (94.4 kg)  08/19/23 212 lb 8 oz (96.4 kg)  08/19/22 200  lb (90.7 kg)     GEN: Patient  is in no acute distress HEENT: Normal NECK: No JVD; No carotid bruits LYMPHATICS: No lymphadenopathy CARDIAC: Hear sounds regular, 2/6 systolic murmur at the apex. RESPIRATORY:  Clear to auscultation without rales, wheezing or rhonchi  ABDOMEN: Soft, non-tender, non-distended MUSCULOSKELETAL:  No edema; No deformity  SKIN: Warm and dry NEUROLOGIC:  Alert and oriented x 3 PSYCHIATRIC:  Normal affect   Signed, Jennifer JONELLE Crape, MD  03/01/2024 2:04 PM    [facility]     [1]  Current Meds  Medication Sig   acetaminophen  (TYLENOL ) 325 MG tablet Take 325-650 mg by mouth every 6 (six) hours as needed (for pain.).    albuterol  (VENTOLIN  HFA) 108 (90 Base) MCG/ACT inhaler Inhale 1-2 puffs into the lungs every 6 (six) hours as needed for wheezing or shortness of breath.   aspirin EC 81 MG tablet Take 81 mg by mouth daily.   montelukast (SINGULAIR) 10 MG tablet Take 10 mg by mouth daily.    omeprazole (PRILOSEC) 20 MG capsule Take 20 mg by mouth daily.   PARoxetine (PAXIL) 40 MG tablet Take 40 mg by mouth every morning.   [DISCONTINUED] anastrozole  (ARIMIDEX ) 1 MG tablet TAKE 1 TABLET BY MOUTH EVERY EVENING.   "

## 2024-03-01 NOTE — Patient Instructions (Addendum)
 Medication Instructions:  Your physician has recommended you make the following change in your medication:   Start Pitvastatin 2 mg daily  Start CoQ 10 200 mg daily (over the counter with the supplements).  Use nitroglycerin  1 tablet placed under the tongue at the first sign of chest pain or an angina attack. 1 tablet may be used every 5 minutes as needed, for up to 15 minutes. Do not take more than 3 tablets in 15 minutes. If pain persist call 911 or go to the nearest ED.   *If you need a refill on your cardiac medications before your next appointment, please call your pharmacy*   Lab Work: Your physician recommends that you return for lab work in: the next few days for CMP and fasting lipids. You need to have labs done when you are fasting.  You can come Monday through Friday 8:30 am to 12:00 pm and 1:15 to 4:30. You do not need to make an appointment as the order has already been placed.   If you have labs (blood work) drawn today and your tests are completely normal, you will receive your results only by: MyChart Message (if you have MyChart) OR A paper copy in the mail If you have any lab test that is abnormal or we need to change your treatment, we will call you to review the results.  Testing/Procedures: Your physician has requested that you have an echocardiogram. Echocardiography is a painless test that uses sound waves to create images of your heart. It provides your doctor with information about the size and shape of your heart and how well your hearts chambers and valves are working. This procedure takes approximately one hour. There are no restrictions for this procedure. Please do NOT wear cologne, perfume, aftershave, or lotions (deodorant is allowed). Please arrive 15 minutes prior to your appointment time.  Please note: We ask at that you not bring children with you during ultrasound (echo/ vascular) testing. Due to room size and safety concerns, children are not allowed  in the ultrasound rooms during exams. Our front office staff cannot provide observation of children in our lobby area while testing is being conducted. An adult accompanying a patient to their appointment will only be allowed in the ultrasound room at the discretion of the ultrasound technician under special circumstances. We apologize for any inconvenience.    Your cardiac CT will be scheduled at one of the below locations:   [facility] [address] [place], [place] [address] [phone] (Severe contrast allergies only)  OR   [facility] [address] [place], [place] [address] [phone]  OR   [facility] [address] [place], [place] [address] [phone]  OR   [facility] and Vascular Tower [address]  [place], [place] [address]  OR   [facility] [address] [place], [place] [phone]  If scheduled at [facility], please arrive at the [facility] and Children's Entrance (Entrance C2) of [facility] 30 minutes prior to test start time. You can use the FREE valet parking offered at entrance C (encouraged to control the heart rate for the test)  Proceed to the [facility] Radiology Department (first floor) to check-in and test prep.  All radiology patients and guests should use entrance C2 at [facility], accessed from [facility], even though the hospital's physical address listed is [address].  If scheduled at the  Heart and Vascular Tower at [address], please enter the parking lot using the [address] entrance and use the FREE valet service at the patient drop-off area. Enter the building and check-in with registration on the main floor.  If scheduled at [facility], please arrive to the Heart and Vascular Center 15 mins early for check-in and test prep.  There is spacious parking and easy access to the  radiology department from the [facility] Heart and Vascular entrance. Please enter here and check-in with the desk attendant.   If scheduled at [facility], please arrive 30 minutes early for check-in and test prep.  Please follow these instructions carefully (unless otherwise directed):  An IV will be required for this test and Nitroglycerin  will be given.  Hold all erectile dysfunction medications at least 3 days (72 hrs) prior to test. (Ie viagra, cialis, sildenafil, tadalafil, etc)   On the Night Before the Test: Be sure to Drink plenty of water . Do not consume any caffeinated/decaffeinated beverages or chocolate 12 hours prior to your test. Do not take any antihistamines 12 hours prior to your test.  If the patient has contrast allergy: Patient will need a prescription for Prednisone  and very clear instructions (as follows): Prednisone  50 mg - take 13 hours prior to test Take another Prednisone  50 mg 7 hours prior to test Take another Prednisone  50 mg 1 hour prior to test Take Benadryl 50 mg 1 hour prior to test Patient must complete all four doses of above prophylactic medications. Patient will need a ride after test due to Benadryl.  On the Day of the Test: Drink plenty of water  until 1 hour prior to the test. Do not eat any food 1 hour prior to test. You may take your regular medications prior to the test.  Take metoprolol  (Lopressor ) 100 mg two hours prior to test. FEMALES- please wear underwire-free bra if available, avoid dresses & tight clothing       After the Test: Drink plenty of water . After receiving IV contrast, you may experience a mild flushed feeling. This is normal. On occasion, you may experience a mild rash up to 24 hours after the test. This is not dangerous. If this occurs, you can take Benadryl 25 mg, Zyrtec, Claritin, or Allegra and increase your fluid intake. (Patients taking Tikosyn should avoid Benadryl, and may take Zyrtec, Claritin, or Allegra) If  you experience trouble breathing, this can be serious. If it is severe call 911 IMMEDIATELY. If it is mild, please call our office.  We will call to schedule your test 2-4 weeks out understanding that some insurance companies will need an authorization prior to the service being performed.   For more information and frequently asked questions, please visit our website : [email or web address]  For non-scheduling related questions, please contact the cardiac imaging nurse navigator should you have any questions/concerns: Cardiac Imaging Nurse Navigators Direct Office Dial: [phone]   For scheduling needs, including cancellations and rescheduling, please call Brittany, [phone].  Follow-Up: At [facility], you and your health needs are our priority.  As part of our continuing mission to provide you with exceptional heart care, we have created designated Provider Care Teams.  These Care Teams include your primary Cardiologist (physician) and Advanced Practice Providers (APPs -  Physician Assistants and Nurse Practitioners) who all work together to provide you with the care you need, when you need it.  We recommend signing up for the patient portal called MyChart.  Sign up  information is provided on this After Visit Summary.  MyChart is used to connect with patients for Virtual Visits (Telemedicine).  Patients are able to view lab/test results, encounter notes, upcoming appointments, etc.  Non-urgent messages can be sent to your provider as well.   To learn more about what you can do with MyChart, go to [email or web address].    Your next appointment:   4-5 week(s)  The format for your next appointment:   In Person  Provider:   Jennifer Crape, MD   Other Instructions Echocardiogram An echocardiogram is a test that uses sound waves (ultrasound) to produce images of the heart. Images from an echocardiogram can provide important information about: Heart size and  shape. The size and thickness and movement of your heart's walls. Heart muscle function and strength. Heart valve function or if you have stenosis. Stenosis is when the heart valves are too narrow. If blood is flowing backward through the heart valves (regurgitation). A tumor or infectious growth around the heart valves. Areas of heart muscle that are not working well because of poor blood flow or injury from a heart attack. Aneurysm detection. An aneurysm is a weak or damaged part of an artery wall. The wall bulges out from the normal force of blood pumping through the body. Tell a health care provider about: Any allergies you have. All medicines you are taking, including vitamins, herbs, eye drops, creams, and over-the-counter medicines. Any blood disorders you have. Any surgeries you have had. Any medical conditions you have. Whether you are pregnant or may be pregnant. What are the risks? Generally, this is a safe test. However, problems may occur, including an allergic reaction to dye (contrast) that may be used during the test. What happens before the test? No specific preparation is needed. You may eat and drink normally. What happens during the test? You will take off your clothes from the waist up and put on a hospital gown. Electrodes or electrocardiogram (ECG)patches may be placed on your chest. The electrodes or patches are then connected to a device that monitors your heart rate and rhythm. You will lie down on a table for an ultrasound exam. A gel will be applied to your chest to help sound waves pass through your skin. A handheld device, called a transducer, will be pressed against your chest and moved over your heart. The transducer produces sound waves that travel to your heart and bounce back (or echo back) to the transducer. These sound waves will be captured in real-time and changed into images of your heart that can be viewed on a video monitor. The images will be  recorded on a computer and reviewed by your health care provider. You may be asked to change positions or hold your breath for a short time. This makes it easier to get different views or better views of your heart. In some cases, you may receive contrast through an IV in one of your veins. This can improve the quality of the pictures from your heart. The procedure may vary among health care providers and hospitals.   What can I expect after the test? You may return to your normal, everyday life, including diet, activities, and medicines, unless your health care provider tells you not to do that. Follow these instructions at home: It is up to you to get the results of your test. Ask your health care provider, or the department that is doing the test, when your results will be ready. Keep all follow-up visits.  This is important. Summary An echocardiogram is a test that uses sound waves (ultrasound) to produce images of the heart. Images from an echocardiogram can provide important information about the size and shape of your heart, heart muscle function, heart valve function, and other possible heart problems. You do not need to do anything to prepare before this test. You may eat and drink normally. After the echocardiogram is completed, you may return to your normal, everyday life, unless your health care provider tells you not to do that. This information is not intended to replace advice given to you by your health care provider. Make sure you discuss any questions you have with your health care provider. Document Revised: 10/12/2019 Document Reviewed: 10/12/2019 Elsevier Patient Education  2021 Elsevier Inc.  Cardiac CT Angiogram A cardiac CT angiogram is a procedure to look at the heart and the area around the heart. It may be done to help find the cause of chest pains or other symptoms of heart disease. During this procedure, a substance called contrast dye is injected into a vein in the  arm. The contrast highlights the blood vessels in the area to be checked. A large X-ray machine (CT scanner), then takes detailed pictures of the heart and the surrounding area. The procedure is also sometimes called a coronary CT angiogram, coronary artery scanning, or CTA. A cardiac CT angiogram allows the health care provider to see how well blood is flowing to and from the heart. The provider will be able to see if there are any problems, such as: Blockage or narrowing of the arteries in the heart. Fluid around the heart. Signs of weakness or disease in the muscles, valves, and tissues of the heart. Tell a health care provider about: Any allergies you have. This is especially important if you have had a previous allergic reaction to medicines, contrast dye, or iodine . All medicines you are taking, including vitamins, herbs, eye drops, creams, and over-the-counter medicines. Any bleeding problems you have. Any surgeries you have had. Any medical conditions you have, including kidney problems or kidney failure. Whether you are pregnant or may be pregnant. Any anxiety disorders, chronic pain, or other conditions you have. These may increase your stress or prevent you from lying still. Any history of abnormal heart rhythms or heart procedures. What are the risks? Your provider will talk with you about risks. These may include: Bleeding. Infection. Allergic reactions to medicines or dyes. Damage to other structures or organs. Kidney damage from the contrast dye. Increased risk of cancer from radiation exposure. This risk is low. Talk with your provider about: The risks and benefits of testing. How you can receive the lowest dose of radiation. What happens before the procedure? Wear comfortable clothing and remove any jewelry, glasses, dentures, and hearing aids. Follow instructions from your provider about eating and drinking. These may include: 12 hours before the procedure Avoid  caffeine. This includes tea, coffee, soda, energy drinks, and diet pills. Drink plenty of water  or other fluids that do not have caffeine in them. Being well hydrated can prevent complications. 4-6 hours before the procedure Stop eating and drinking. This will reduce the risk of nausea from the contrast dye. Ask your provider about changing or stopping your regular medicines. These include: Diabetes medicines. Medicines to treat problems with erections (erectile dysfunction). If you have kidney problems, you may need to receive IV hydration before and after the test. What happens during the procedure?  Hair on your chest may need to  be removed so that small sticky patches called electrodes can be placed on your chest. These will transmit information that helps to monitor your heart during the procedure. An IV will be inserted into one of your veins. You might be given a medicine to control your heart rate during the procedure. This will help to ensure that good images are obtained. You will be asked to lie on an exam table. This table will slide in and out of the CT machine during the procedure. Contrast dye will be injected into the IV. You might feel warm, or you may get a metallic taste in your mouth. You may be given medicines to relax or dilate the arteries in your heart. If you are allergic to contrast dyes or iodine  you may be given medicine before the test to reduce the risk of an allergic reaction. The table that you are lying on will move into the CT machine tunnel for the scan. The person running the machine will give you instructions while the scans are being done. You may be asked to: Keep your arms above your head. Hold your breath for short periods. Stay very still, even if the table is moving. The procedure may vary among providers and hospitals. What can I expect after the procedure? After your procedure, it is common to have: A metallic taste in your mouth from the contrast  dye. A feeling of warmth. A headache from the heart medicine. Follow these instructions at home: Take over-the-counter and prescription medicines only as told by your provider. If you are told, drink enough fluid to keep your pee pale yellow. This will help to flush the contrast dye out of your body. Most people can return to their normal activities right after the procedure. Ask your provider what activities are safe for you. It is up to you to get the results of your procedure. Ask your provider, or the department that is doing the procedure, when your results will be ready. Contact a health care provider if: You have any symptoms of allergy to the contrast dye. These include: Shortness of breath. Rash or hives. A racing heartbeat. You notice a change in your peeing (urination). This information is not intended to replace advice given to you by your health care provider. Make sure you discuss any questions you have with your health care provider. Document Revised: 09/21/2021 Document Reviewed: 09/21/2021 Elsevier Patient Education  2024 Elsevier Inc. Important Information About Sugar

## 2024-03-02 ENCOUNTER — Other Ambulatory Visit: Payer: Self-pay | Admitting: Cardiology

## 2024-03-03 ENCOUNTER — Other Ambulatory Visit (HOSPITAL_COMMUNITY): Payer: Self-pay

## 2024-03-03 ENCOUNTER — Telehealth: Payer: Self-pay | Admitting: Cardiology

## 2024-03-03 ENCOUNTER — Telehealth: Payer: Self-pay

## 2024-03-03 NOTE — Telephone Encounter (Signed)
 Pharmacy Patient Advocate Encounter   Received notification from Patient Pharmacy that prior authorization for PITAVASTATIN is required/requested.   Insurance verification completed.   The patient is insured through [facility].   Per test claim: PA required; PA submitted to above mentioned insurance via Latent Key/confirmation #/EOC [id number] Status is pending

## 2024-03-03 NOTE — Telephone Encounter (Signed)
" °*  STAT* If patient is at the pharmacy, call can be transferred to refill team.   1. Which medications need to be refilled? (please list name of each medication and dose if known)   Pitavastatin Calcium  2 MG TABS     2. Would you like to learn more about the convenience, safety, & potential cost savings by using the [facility]? No   3. Are you open to using the [facility] (Type [facility].) No   4. Which pharmacy/location (including street and city if local pharmacy) is medication to be sent to?  [employer] - [facility] - [place] - [address]   5. Do they need a 30 day or 90 day supply? 90 day  Pt would like for all meds to be sent to above pharmacy from here on  "

## 2024-03-03 NOTE — Telephone Encounter (Signed)
 PT wants to wait to send this for Friday 03/05/24  because her [employer] does not start until 03/04/24

## 2024-03-04 LAB — COMPREHENSIVE METABOLIC PANEL WITH GFR
ALT: 13 IU/L (ref 0–32)
AST: 18 IU/L (ref 0–40)
Albumin: 4.1 g/dL (ref 3.8–4.8)
Alkaline Phosphatase: 76 IU/L (ref 49–135)
BUN/Creatinine Ratio: 18 (ref 12–28)
BUN: 13 mg/dL (ref 8–27)
Bilirubin Total: 0.6 mg/dL (ref 0.0–1.2)
CO2: 23 mmol/L (ref 20–29)
Calcium: 9.4 mg/dL (ref 8.7–10.3)
Chloride: 104 mmol/L (ref 96–106)
Creatinine, Ser: 0.74 mg/dL (ref 0.57–1.00)
Globulin, Total: 2.2 g/dL (ref 1.5–4.5)
Glucose: 139 mg/dL — ABNORMAL HIGH (ref 70–99)
Potassium: 4.2 mmol/L (ref 3.5–5.2)
Sodium: 142 mmol/L (ref 134–144)
Total Protein: 6.3 g/dL (ref 6.0–8.5)
eGFR: 86 mL/min/1.73

## 2024-03-04 LAB — LIPID PANEL
Chol/HDL Ratio: 4.5 ratio — ABNORMAL HIGH (ref 0.0–4.4)
Cholesterol, Total: 277 mg/dL — ABNORMAL HIGH (ref 100–199)
HDL: 61 mg/dL
LDL Chol Calc (NIH): 191 mg/dL — ABNORMAL HIGH (ref 0–99)
Triglycerides: 140 mg/dL (ref 0–149)
VLDL Cholesterol Cal: 25 mg/dL (ref 5–40)

## 2024-03-05 ENCOUNTER — Encounter (HOSPITAL_COMMUNITY): Payer: Self-pay

## 2024-03-05 ENCOUNTER — Ambulatory Visit: Payer: Self-pay | Admitting: Cardiology

## 2024-03-05 DIAGNOSIS — E785 Hyperlipidemia, unspecified: Secondary | ICD-10-CM

## 2024-03-05 DIAGNOSIS — R931 Abnormal findings on diagnostic imaging of heart and coronary circulation: Secondary | ICD-10-CM

## 2024-03-05 NOTE — Telephone Encounter (Signed)
"  Referral placed   "

## 2024-03-05 NOTE — Telephone Encounter (Signed)
 Pharmacy Patient Advocate Encounter  Received notification from [employer] [facility] that Prior Authorization for PITAVASTATIN has been APPROVED from 03/04/24 to 03/03/25

## 2024-03-05 NOTE — Telephone Encounter (Signed)
-----   Message from Jennifer Crape, MD sent at 03/05/2024 11:29 AM EST ----- Refer to lipid clinic. And [email or web address] pcp Jennifer JONELLE Crape, MD 03/05/2024 11:28 AM

## 2024-03-08 ENCOUNTER — Telehealth (HOSPITAL_COMMUNITY): Payer: Self-pay | Admitting: Emergency Medicine

## 2024-03-08 ENCOUNTER — Ambulatory Visit (HOSPITAL_BASED_OUTPATIENT_CLINIC_OR_DEPARTMENT_OTHER)
Admission: EM | Admit: 2024-03-08 | Discharge: 2024-03-08 | Disposition: A | Discharge status: Home / Self Care | Attending: Nurse Practitioner | Admitting: Nurse Practitioner

## 2024-03-08 ENCOUNTER — Encounter (HOSPITAL_BASED_OUTPATIENT_CLINIC_OR_DEPARTMENT_OTHER): Payer: Self-pay | Admitting: Emergency Medicine

## 2024-03-08 DIAGNOSIS — R509 Fever, unspecified: Secondary | ICD-10-CM | POA: Diagnosis not present

## 2024-03-08 DIAGNOSIS — J069 Acute upper respiratory infection, unspecified: Secondary | ICD-10-CM | POA: Diagnosis not present

## 2024-03-08 LAB — POCT INFLUENZA A/B
Influenza A, POC: NEGATIVE
Influenza B, POC: NEGATIVE

## 2024-03-08 LAB — POC SOFIA SARS ANTIGEN FIA: SARS Coronavirus 2 Ag: NEGATIVE

## 2024-03-08 NOTE — Discharge Instructions (Addendum)
 You were seen today for symptoms consistent with a viral upper respiratory infection. Your flu and COVID tests were negative, and your exam did not show signs of a bacterial infection or any acute heart or lung problems. Viral illnesses usually improve on their own over several days with supportive care.  To help manage symptoms, continue using Tylenol  as needed for fever, body aches, or headaches. Stay well hydrated, get plenty of rest, and consider using saline nasal spray, a humidifier, or warm showers to help with congestion and head pressure. Cough can linger for several weeks, even as other symptoms improve.  Follow up with your primary care provider if symptoms are not improving within one week, if fevers persist, or if new symptoms develop. Go to the emergency department right away if you develop shortness of breath, chest pain, persistent high fever, inability to keep fluids down, confusion, or any sudden worsening of symptoms.  In regard to your cardiac procedure scheduled for tomorrow, you should contact the procedural office today, inform them of your current viral illness and negative COVID and flu test results, and ask whether it is safe to proceed as planned or if rescheduling is recommended.

## 2024-03-08 NOTE — ED Provider Notes (Signed)
 " [facility]    CSN: [id number] Arrival date & time: 03/08/24  1023      History   Chief Complaint Chief Complaint  Patient presents with   Cough   Fever    HPI Melinda Wolfe is a 74 y.o. female.   Discussed the use of AI scribe software for clinical note transcription with the patient, who gave verbal consent to proceed.   The patient presents with a four-day history of upper respiratory symptoms that began on Thursday of last week. She reports head pressure, nasal congestion, and rhinorrhea, along with a cough that is intermittently productive with small amounts of sputum. She experienced wheezing last night while attempting to sleep but denies shortness of breath. She notes body aches two days ago and mild fevers with a maximum temperature of 100.39F. She also reports headaches associated with the head pressure. Symptoms have been managed with Tylenol . She denies known recent exposure to sick contacts prior to symptom onset. She denies post-nasal drainage, chills, sneezing, sore throat, nausea, vomiting, or diarrhea. She reports no ear pain or ear-related symptoms. The patient is scheduled for a cardiac procedure tomorrow in [facility] and is seeking guidance on whether she should proceed as planned or consider rescheduling in light of her current symptoms.  The following sections of the patient's history were reviewed and updated as appropriate: allergies, current medications, past family history, past medical history, past social history, past surgical history, and problem list.     Past Medical History:  Diagnosis Date   Abnormal CXR 03/30/2016   Abnormal EKG 03/18/2019   Anxiety 07/19/2015   Last Assessment & Plan:  Relevant Hx: Course: Daily Update: Today's Plan:this is stable for her at this time and will follow on her current dose of meds  Electronically signed by: Eleanor Merlynn Lady, NP 07/20/15 1230   Asthma    uses an inhaler   Biliary  dyskinesia 11/23/2019   BMI 34.0-34.9,adult 11/23/2019   BMI 35.0-35.9,adult 11/23/2019   Breast cancer (HCC) 2018   Left Breast Cancer/ 22 radiation treatments   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   Complication of anesthesia    per pt,hard to wake up after sedation!   Depression    Gastroesophageal reflux disease without esophagitis 07/20/2015   Last Assessment & Plan:  Relevant Hx: Course: Daily Update: Today's Plan:she has been stable on the PPI  But she is trying to wean this down to qod which is fine for her to do  Electronically signed by: Eleanor Merlynn Lady, NP 07/20/15 1235   GERD (gastroesophageal reflux disease)    History of basal cell cancer 04/06/2020   Formatting of this note might be different from the original. Following closely with dermatology   History of breast cancer 08/15/2016   History of COVID-19 05/12/2019   Hypercalcemia 07/19/2015   Malaise and fatigue 07/19/2015   Last Assessment & Plan:  Relevant Hx: Course: Daily Update: Today's Plan:this is off and on for her and mainly with her work that is very stressful for her despite being there 30 years it is still quite hard on her  Electronically signed by: Eleanor Merlynn Lady, NP 07/20/15 1231   Mild intermittent asthma without complication 07/19/2015   Last Assessment & Plan:  Relevant Hx: Course: Daily Update: Today's Plan:this is stable for her at this time and she has inhaler to use if she needs to but she has not had to in some  time  Electronically signed by: Eleanor Merlynn Lady, NP 07/20/15 1230   Mixed hyperlipidemia 07/19/2015   Last Assessment & Plan:  Relevant Hx: Course: Daily Update: Today's Plan:she is going to have her lipids checked she cannot take statins due to the fact that she has such severe myalagias with them  Electronically signed by: Eleanor Merlynn Lady, NP 07/20/15 1231   Osteoarthritis of hip 12/30/2018   Personal history  of radiation therapy 2018   Left Breast Cancer/ 22 treatments   Post-operative state 07/08/2019   Postoperative examination 12/20/2019   Prediabetes 11/07/2017   Recurrent major depressive disorder 07/19/2015   Last Assessment & Plan:  Relevant Hx: Course: Daily Update: Today's Plan:she feels this is stable for her and will follow  Electronically signed by: Eleanor Merlynn Lady, NP 07/20/15 1231   Special screening examination for viral disease 11/23/2019   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   Qualifier: Diagnosis of  By: Latisha CMA, Leigh     Vitamin D  deficiency 07/19/2015   Last Assessment & Plan:  Relevant Hx: Course: Daily Update: Today's Plan:update her level for her today she is taking her vitamin d  daily now and will continue with this  Electronically signed by: Eleanor Merlynn Lady, NP 07/20/15 1232    Patient Active Problem List   Diagnosis Date Noted   Aortic atherosclerosis 03/01/2024   Elevated coronary artery calcium  score 03/01/2024   Familial hyperlipidemia 03/01/2024   Angina pectoris 03/01/2024   Cardiac murmur 03/01/2024   GERD (gastroesophageal reflux disease)    Depression    Complication of anesthesia    Asthma    History of basal cell cancer 04/06/2020   Postoperative examination 12/20/2019   Biliary dyskinesia 11/23/2019   BMI 34.0-34.9,adult 11/23/2019   Special screening examination for viral disease 11/23/2019   BMI 35.0-35.9,adult 11/23/2019   Post-operative state 07/08/2019   History of COVID-19 05/12/2019   Abnormal EKG 03/18/2019   Osteoarthritis of hip 12/30/2018   Prediabetes 11/07/2017   History of breast cancer 08/15/2016   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   Abnormal CXR 03/30/2016   Personal history of radiation therapy 2018   Breast cancer (HCC) 2018   Gastroesophageal reflux disease without esophagitis 07/20/2015   Anxiety 07/19/2015   Hypercalcemia 07/19/2015   Malaise and fatigue 07/19/2015    Mild intermittent asthma without complication 07/19/2015   Mixed hyperlipidemia 07/19/2015   Recurrent major depressive disorder 07/19/2015   Vitamin D  deficiency 07/19/2015   ASTHMA 11/11/2007   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    Past Surgical History:  Procedure Laterality Date   BREAST BIOPSY Left 04/22/2016   BREAST LUMPECTOMY Left 05/2016   BREAST LUMPECTOMY WITH RADIOACTIVE SEED AND SENTINEL LYMPH NODE BIOPSY Left 05/02/2016   Procedure: BREAST LUMPECTOMY WITH RADIOACTIVE SEED AND SENTINEL LYMPH NODE BIOPSY;  Surgeon: Donnice Bury, MD;  Location: [facility];  Service: General;  Laterality: Left;   COLONOSCOPY     POLYPECTOMY     TOTAL HIP ARTHROPLASTY Left 05/26/2019   Procedure: TOTAL HIP ARTHROPLASTY ANTERIOR APPROACH;  Surgeon: Melodi Lerner, MD;  Location: [facility];  Service: Orthopedics;  Laterality: Left;     OB History   No obstetric history on file.      Home Medications    Prior to Admission medications  Medication Sig Start Date End Date Taking? Authorizing Provider  montelukast  (SINGULAIR ) 10 MG tablet Take 10 mg by mouth daily.    Yes [provider]  omeprazole (PRILOSEC) 20 MG  capsule Take 20 mg by mouth daily.   Yes [provider]  PARoxetine  (PAXIL ) 40 MG tablet Take 40 mg by mouth every morning.   Yes [provider]  acetaminophen  (TYLENOL ) 325 MG tablet Take 325-650 mg by mouth every 6 (six) hours as needed (for pain.).     [provider]  albuterol  (VENTOLIN  HFA) 108 (90 Base) MCG/ACT inhaler Inhale 1-2 puffs into the lungs every 6 (six) hours as needed for wheezing or shortness of breath. 02/16/24   Adah Corning A, FNP  aspirin EC 81 MG tablet Take 81 mg by mouth daily.    [provider]  metoprolol  tartrate (LOPRESSOR ) 100 MG tablet Take 1 tablet (100 mg total) by mouth once. Take 2 hours prior to your CT if your heart rate is greater than 55 03/01/24 03/08/24  Revankar, Jennifer SAUNDERS, MD   nitroGLYCERIN  (NITROSTAT ) 0.4 MG SL tablet Place 1 tablet (0.4 mg total) under the tongue every 5 (five) minutes as needed. 03/01/24 05/30/24  Revankar, Jennifer SAUNDERS, MD  Pitavastatin  Calcium  2 MG TABS Take 1 tablet (2 mg total) by mouth daily. 03/01/24   Revankar, Jennifer SAUNDERS, MD    Family History Family History  Problem Relation Age of Onset   Colon cancer Mother    Stroke Father    Hypertension Sister    Breast cancer Neg Hx    Colon polyps Neg Hx    Esophageal cancer Neg Hx    Rectal cancer Neg Hx    Stomach cancer Neg Hx     Social History Social History[1]   Allergies   Latex, Levofloxacin, and Statins   Review of Systems Review of Systems  Constitutional:  Positive for fever (TMAX 100.5). Negative for chills.  HENT:  Positive for congestion, rhinorrhea and sinus pressure. Negative for postnasal drip, sneezing and sore throat.   Respiratory:  Positive for cough (productive) and wheezing. Negative for shortness of breath.   Gastrointestinal:  Negative for diarrhea, nausea and vomiting.  Musculoskeletal:  Positive for myalgias.  Neurological:  Positive for headaches.  All other systems reviewed and are negative.    Physical Exam Triage Vital Signs ED Triage Vitals  Encounter Vitals Group     BP 03/08/24 1035 (!) 105/58     Girls Systolic BP Percentile --      Girls Diastolic BP Percentile --      Boys Systolic BP Percentile --      Boys Diastolic BP Percentile --      Pulse Rate 03/08/24 1035 (!) 105     Resp 03/08/24 1035 18     Temp 03/08/24 1035 97.8 F (36.6 C)     Temp Source 03/08/24 1035 Oral     SpO2 03/08/24 1035 93 %     Weight --      Height --      Head Circumference --      Peak Flow --      Pain Score 03/08/24 1034 0     Pain Loc --      Pain Education --      Exclude from Growth Chart --    No data found.  Updated Vital Signs BP (!) 105/58 (BP Location: Right Arm)   Pulse (!) 105   Temp 97.8 F (36.6 C) (Oral)   Resp 18   SpO2 93%    Visual Acuity Right Eye Distance:   Left Eye Distance:   Bilateral Distance:    Right Eye Near:  Left Eye Near:    Bilateral Near:     Physical Exam Vitals reviewed.  Constitutional:      General: She is awake. She is not in acute distress.    Appearance: Normal appearance. She is well-developed. She is not ill-appearing, toxic-appearing or diaphoretic.  HENT:     Head: Normocephalic.     Right Ear: Hearing, tympanic membrane, ear canal and external ear normal. No drainage, swelling or tenderness. No middle ear effusion. Tympanic membrane is not erythematous.     Left Ear: Hearing, tympanic membrane, ear canal and external ear normal. No drainage, swelling or tenderness.  No middle ear effusion. Tympanic membrane is not erythematous.     Nose: Nose normal.     Mouth/Throat:     Lips: Pink.     Mouth: Mucous membranes are moist.     Pharynx: Oropharynx is clear. Uvula midline. No pharyngeal swelling, oropharyngeal exudate, posterior oropharyngeal erythema or uvula swelling.     Tonsils: No tonsillar exudate or tonsillar abscesses.  Eyes:     General: Vision grossly intact.     Conjunctiva/sclera: Conjunctivae normal.  Cardiovascular:     Rate and Rhythm: Normal rate and regular rhythm.     Heart sounds: Normal heart sounds.  Pulmonary:     Effort: Pulmonary effort is normal. No tachypnea or respiratory distress.     Breath sounds: Normal breath sounds and air entry. No wheezing.     Comments: Respirations even and unlabored  Musculoskeletal:        General: Normal range of motion.     Cervical back: Full passive range of motion without pain, normal range of motion and neck supple.  Lymphadenopathy:     Cervical: No cervical adenopathy.  Skin:    General: Skin is warm and dry.  Neurological:     General: No focal deficit present.     Mental Status: She is alert and oriented to person, place, and time.  Psychiatric:        Speech: Speech normal.        Behavior:  Behavior is cooperative.      UC Treatments / Results  Labs (all labs ordered are listed, but only abnormal results are displayed) Labs Reviewed  POCT INFLUENZA A/B - Normal  POC SOFIA SARS ANTIGEN FIA - Normal    EKG   Radiology No results found.  Procedures Procedures (including critical care time)  Medications Ordered in UC Medications - No data to display  Initial Impression / Assessment and Plan / UC Course  I have reviewed the triage vital signs and the nursing notes.  Pertinent labs & imaging results that were available during my care of the patient were reviewed by me and considered in my medical decision making (see chart for details).     The patient presents with upper respiratory symptoms consistent with a viral upper respiratory infection. Influenza and COVID testing are negative. Physical examination is reassuring, with no evidence of bacterial infection or acute cardiopulmonary pathology. Given the viral etiology, supportive care is recommended. The patient was advised to continue symptomatic management and to follow up with her primary care provider if symptoms do not improve within one week or if new concerns develop. She was instructed to seek emergency care for worsening symptoms, including shortness of breath, chest pain, persistent high fever, inability to tolerate fluids, or confusion. Regarding her cardiac procedure scheduled for tomorrow, the patient was advised to contact the procedural office to inform them of her  current viral illness and negative COVID and influenza test results and to obtain guidance on whether it is appropriate to proceed as scheduled or if rescheduling is recommended.  Today's evaluation has revealed no signs of a dangerous process. Discussed diagnosis with patient and/or guardian. Patient and/or guardian aware of their diagnosis, possible red flag symptoms to watch out for and need for close follow up. Patient and/or guardian  understands verbal and written discharge instructions. Patient and/or guardian comfortable with plan and disposition.  Patient and/or guardian has a clear mental status at this time, good insight into illness (after discussion and teaching) and has clear judgment to make decisions regarding their care  Documentation was completed with the aid of voice recognition software. Transcription may contain typographical errors.   Final Clinical Impressions(s) / UC Diagnoses   Final diagnoses:  Fever, unspecified  Viral upper respiratory tract infection     Discharge Instructions      You were seen today for symptoms consistent with a viral upper respiratory infection. Your flu and COVID tests were negative, and your exam did not show signs of a bacterial infection or any acute heart or lung problems. Viral illnesses usually improve on their own over several days with supportive care.  To help manage symptoms, continue using Tylenol  as needed for fever, body aches, or headaches. Stay well hydrated, get plenty of rest, and consider using saline nasal spray, a humidifier, or warm showers to help with congestion and head pressure. Cough can linger for several weeks, even as other symptoms improve.  Follow up with your primary care provider if symptoms are not improving within one week, if fevers persist, or if new symptoms develop. Go to the emergency department right away if you develop shortness of breath, chest pain, persistent high fever, inability to keep fluids down, confusion, or any sudden worsening of symptoms.  In regard to your cardiac procedure scheduled for tomorrow, you should contact the procedural office today, inform them of your current viral illness and negative COVID and flu test results, and ask whether it is safe to proceed as planned or if rescheduling is recommended.     ED Prescriptions   None    PDMP not reviewed this encounter.     [1]  Social History Tobacco Use    Smoking status: Former    Current packs/day: 0.00    Types: Cigarettes    Quit date: 08/21/2012    Years since quitting: 11.5   Smokeless tobacco: Never  Vaping Use   Vaping status: Former  Substance Use Topics   Alcohol use: Yes    Comment: social   Drug use: No     Melinda Lukes, FNP 03/08/24 1123  "

## 2024-03-08 NOTE — Telephone Encounter (Signed)
 Reaching out to patient to offer assistance regarding upcoming cardiac imaging study; pt verbalizes understanding of appt date/time, parking situation and where to check in, pre-test NPO status and medications ordered, and verified current allergies; name and call back number provided for further questions should they arise Rockwell Alexandria RN Navigator Cardiac Imaging [facility] [phone] office [phone] cell

## 2024-03-08 NOTE — ED Triage Notes (Signed)
 Pt reports on Thursday she felt like she was getting a cold, she has head pressure and coughing, fever 100.5, body aches.

## 2024-03-08 NOTE — Telephone Encounter (Signed)
 Medication sent 03/01/2024

## 2024-03-09 ENCOUNTER — Emergency Department (HOSPITAL_BASED_OUTPATIENT_CLINIC_OR_DEPARTMENT_OTHER)

## 2024-03-09 ENCOUNTER — Ambulatory Visit (HOSPITAL_BASED_OUTPATIENT_CLINIC_OR_DEPARTMENT_OTHER)
Admission: RE | Admit: 2024-03-09 | Discharge: 2024-03-09 | Disposition: A | Discharge status: Home / Self Care | Source: Ambulatory Visit | Attending: Cardiology | Admitting: Cardiology

## 2024-03-09 ENCOUNTER — Other Ambulatory Visit: Payer: Self-pay

## 2024-03-09 ENCOUNTER — Encounter (HOSPITAL_BASED_OUTPATIENT_CLINIC_OR_DEPARTMENT_OTHER): Payer: Self-pay | Admitting: Emergency Medicine

## 2024-03-09 ENCOUNTER — Inpatient Hospital Stay (HOSPITAL_BASED_OUTPATIENT_CLINIC_OR_DEPARTMENT_OTHER)
Admission: EM | Admit: 2024-03-09 | Discharge: 2024-03-12 | DRG: 175 | Disposition: A | Discharge status: Home / Self Care | Attending: Internal Medicine | Admitting: Internal Medicine

## 2024-03-09 ENCOUNTER — Encounter (HOSPITAL_BASED_OUTPATIENT_CLINIC_OR_DEPARTMENT_OTHER): Payer: Self-pay

## 2024-03-09 DIAGNOSIS — Z853 Personal history of malignant neoplasm of breast: Secondary | ICD-10-CM

## 2024-03-09 DIAGNOSIS — J9601 Acute respiratory failure with hypoxia: Principal | ICD-10-CM | POA: Diagnosis present

## 2024-03-09 DIAGNOSIS — Z87891 Personal history of nicotine dependence: Secondary | ICD-10-CM

## 2024-03-09 DIAGNOSIS — Z85828 Personal history of other malignant neoplasm of skin: Secondary | ICD-10-CM

## 2024-03-09 DIAGNOSIS — I2699 Other pulmonary embolism without acute cor pulmonale: Secondary | ICD-10-CM

## 2024-03-09 DIAGNOSIS — F339 Major depressive disorder, recurrent, unspecified: Secondary | ICD-10-CM | POA: Diagnosis present

## 2024-03-09 DIAGNOSIS — R001 Bradycardia, unspecified: Secondary | ICD-10-CM | POA: Diagnosis present

## 2024-03-09 DIAGNOSIS — Z7982 Long term (current) use of aspirin: Secondary | ICD-10-CM

## 2024-03-09 DIAGNOSIS — Z96642 Presence of left artificial hip joint: Secondary | ICD-10-CM | POA: Diagnosis present

## 2024-03-09 DIAGNOSIS — Z923 Personal history of irradiation: Secondary | ICD-10-CM

## 2024-03-09 DIAGNOSIS — Z888 Allergy status to other drugs, medicaments and biological substances status: Secondary | ICD-10-CM

## 2024-03-09 DIAGNOSIS — R7303 Prediabetes: Secondary | ICD-10-CM | POA: Diagnosis present

## 2024-03-09 DIAGNOSIS — E669 Obesity, unspecified: Secondary | ICD-10-CM | POA: Diagnosis present

## 2024-03-09 DIAGNOSIS — Z8249 Family history of ischemic heart disease and other diseases of the circulatory system: Secondary | ICD-10-CM

## 2024-03-09 DIAGNOSIS — Z881 Allergy status to other antibiotic agents status: Secondary | ICD-10-CM

## 2024-03-09 DIAGNOSIS — I209 Angina pectoris, unspecified: Secondary | ICD-10-CM

## 2024-03-09 DIAGNOSIS — I259 Chronic ischemic heart disease, unspecified: Secondary | ICD-10-CM

## 2024-03-09 DIAGNOSIS — Z79899 Other long term (current) drug therapy: Secondary | ICD-10-CM

## 2024-03-09 DIAGNOSIS — K219 Gastro-esophageal reflux disease without esophagitis: Secondary | ICD-10-CM | POA: Diagnosis present

## 2024-03-09 DIAGNOSIS — I824Y9 Acute embolism and thrombosis of unspecified deep veins of unspecified proximal lower extremity: Secondary | ICD-10-CM

## 2024-03-09 DIAGNOSIS — I4510 Unspecified right bundle-branch block: Secondary | ICD-10-CM | POA: Diagnosis present

## 2024-03-09 DIAGNOSIS — R931 Abnormal findings on diagnostic imaging of heart and coronary circulation: Secondary | ICD-10-CM

## 2024-03-09 DIAGNOSIS — E872 Acidosis, unspecified: Secondary | ICD-10-CM | POA: Diagnosis present

## 2024-03-09 DIAGNOSIS — I2510 Atherosclerotic heart disease of native coronary artery without angina pectoris: Secondary | ICD-10-CM | POA: Diagnosis present

## 2024-03-09 DIAGNOSIS — Z8616 Personal history of COVID-19: Secondary | ICD-10-CM

## 2024-03-09 DIAGNOSIS — J45909 Unspecified asthma, uncomplicated: Secondary | ICD-10-CM | POA: Diagnosis present

## 2024-03-09 DIAGNOSIS — E782 Mixed hyperlipidemia: Secondary | ICD-10-CM | POA: Diagnosis present

## 2024-03-09 DIAGNOSIS — Z91040 Latex allergy status: Secondary | ICD-10-CM

## 2024-03-09 HISTORY — DX: Other pulmonary embolism without acute cor pulmonale: I26.99

## 2024-03-09 LAB — BASIC METABOLIC PANEL WITH GFR
Anion gap: 15 (ref 5–15)
BUN: 13 mg/dL (ref 8–23)
CO2: 21 mmol/L — ABNORMAL LOW (ref 22–32)
Calcium: 9.6 mg/dL (ref 8.9–10.3)
Chloride: 103 mmol/L (ref 98–111)
Creatinine, Ser: 0.83 mg/dL (ref 0.44–1.00)
GFR, Estimated: >60 mL/min
Glucose, Bld: 156 mg/dL — ABNORMAL HIGH (ref 70–99)
Potassium: 3.9 mmol/L (ref 3.5–5.1)
Sodium: 139 mmol/L (ref 135–145)

## 2024-03-09 LAB — CBC
HCT: 37.3 % (ref 36.0–46.0)
Hemoglobin: 12.8 g/dL (ref 12.0–15.0)
MCH: 32.6 pg (ref 26.0–34.0)
MCHC: 34.3 g/dL (ref 30.0–36.0)
MCV: 94.9 fL (ref 80.0–100.0)
Platelets: 201 K/uL (ref 150–400)
RBC: 3.93 MIL/uL (ref 3.87–5.11)
RDW: 13.1 % (ref 11.5–15.5)
WBC: 3.7 K/uL — ABNORMAL LOW (ref 4.0–10.5)
nRBC: 0 % (ref 0.0–0.2)

## 2024-03-09 LAB — PRO BRAIN NATRIURETIC PEPTIDE: Pro Brain Natriuretic Peptide: 52.9 pg/mL

## 2024-03-09 LAB — LACTIC ACID, PLASMA
Lactic Acid, Venous: 2 mmol/L (ref 0.5–1.9)
Lactic Acid, Venous: 1.3 mmol/L (ref 0.5–1.9)

## 2024-03-09 LAB — TROPONIN T, HIGH SENSITIVITY
Troponin T High Sensitivity: <15 ng/L (ref 0–19)
Troponin T High Sensitivity: <15 ng/L (ref 0–19)

## 2024-03-09 MED ORDER — HEPARIN (PORCINE) 25000 UT/250ML-% IV SOLN
1100.0000 [IU]/h | INTRAVENOUS | Status: DC
  Administered 2024-03-09: 1250 [IU]/h via INTRAVENOUS
  Administered 2024-03-10: 1000 [IU]/h via INTRAVENOUS
  Filled 2024-03-09 (×2): qty 250

## 2024-03-09 MED ORDER — HEPARIN BOLUS VIA INFUSION
4500.0000 [IU] | Freq: Once | INTRAVENOUS | Status: AC
  Administered 2024-03-09: 4500 [IU] via INTRAVENOUS

## 2024-03-09 MED ORDER — MONTELUKAST SODIUM 10 MG PO TABS
10.0000 mg | ORAL_TABLET | Freq: Every day | ORAL | Status: DC
  Administered 2024-03-10 – 2024-03-12 (×3): 10 mg via ORAL
  Filled 2024-03-09 (×4): qty 1

## 2024-03-09 MED ORDER — SODIUM CHLORIDE 0.9 % IV BOLUS
500.0000 mL | Freq: Once | INTRAVENOUS | Status: AC
  Administered 2024-03-09: 500 mL via INTRAVENOUS

## 2024-03-09 MED ORDER — PAROXETINE HCL 20 MG PO TABS
40.0000 mg | ORAL_TABLET | Freq: Every day | ORAL | Status: DC
  Administered 2024-03-10 – 2024-03-12 (×3): 40 mg via ORAL
  Filled 2024-03-09 (×5): qty 2

## 2024-03-09 MED ORDER — NITROGLYCERIN 0.4 MG SL SUBL: 0.8000 mg | SUBLINGUAL_TABLET | Freq: Once | SUBLINGUAL | Status: DC

## 2024-03-09 MED ORDER — IPRATROPIUM-ALBUTEROL 0.5-2.5 (3) MG/3ML IN SOLN
3.0000 mL | Freq: Once | RESPIRATORY_TRACT | Status: AC
  Administered 2024-03-09: 3 mL via RESPIRATORY_TRACT
  Filled 2024-03-09: qty 3

## 2024-03-09 MED ORDER — IOHEXOL 350 MG/ML SOLN: 100.0000 mL | Freq: Once | INTRAVENOUS | Status: DC | PRN

## 2024-03-09 MED ORDER — MELATONIN 3 MG PO TABS
3.0000 mg | ORAL_TABLET | Freq: Every day | ORAL | Status: DC
  Administered 2024-03-09 – 2024-03-11 (×3): 3 mg via ORAL
  Filled 2024-03-09 (×3): qty 1

## 2024-03-09 MED ORDER — PANTOPRAZOLE SODIUM 40 MG PO TBEC
40.0000 mg | DELAYED_RELEASE_TABLET | Freq: Every day | ORAL | Status: DC
  Administered 2024-03-10 – 2024-03-12 (×3): 40 mg via ORAL
  Filled 2024-03-09 (×4): qty 1

## 2024-03-09 MED ORDER — IOHEXOL 350 MG/ML SOLN
100.0000 mL | Freq: Once | INTRAVENOUS | Status: AC | PRN
  Administered 2024-03-09: 75 mL via INTRAVENOUS

## 2024-03-09 NOTE — ED Notes (Signed)
 Upon moving from wheelchair to bed and getting changed, patient became increasingly short of breath. SpO2 dropped to 86% on RA. Placed on 2L O2 via [place] and instructed to breathe through nose. Increased to 95%

## 2024-03-09 NOTE — ED Notes (Signed)
 Called lab regarding repeat troponin level not being in process yet. Per lab, they will put this into process now.

## 2024-03-09 NOTE — ED Notes (Addendum)
 Patient transferred from waiting room to ED treatment room. Assuming pt care at this time.

## 2024-03-09 NOTE — ED Notes (Signed)
Patient transported to imaging at this time.

## 2024-03-09 NOTE — ED Triage Notes (Signed)
 Pt reports feeling Shob x several weeks.  Increased use of home MDI.  Fever over weekend.

## 2024-03-09 NOTE — Progress Notes (Signed)
 PHARMACY - ANTICOAGULATION CONSULT NOTE  Pharmacy Consult for heparin  Indication: pulmonary embolus  Allergies[1]  Patient Measurements: Height: 5' 5 (165.1 cm) Weight: 90.7 kg (200 lb) IBW/kg (Calculated) : 57 HEPARIN  DW (KG): 77.1  Vital Signs: Temp: 97.8 F (36.6 C) (01/06 1450) Temp Source: Oral (01/06 1450) BP: 130/67 (01/06 1450) Pulse Rate: 82 (01/06 1450)  Labs: Recent Labs    03/09/24 1200  HGB 12.8  HCT 37.3  PLT 201  CREATININE 0.83    Estimated Creatinine Clearance: 68.2 mL/min (by C-G formula based on SCr of 0.83 mg/dL).   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   Depression    Gastroesophageal reflux disease without esophagitis 07/20/2015   Last Assessment & Plan:  Relevant Hx: Course: Daily Update: Today's Plan:she has been stable on the PPI  But she is trying to wean this down to qod which is fine for her to do  Electronically signed by: Eleanor Merlynn Lady, NP 07/20/15 1235   GERD (gastroesophageal reflux disease)    History of basal cell cancer 04/06/2020   Formatting of this note might be different from the original. Following closely with dermatology   History of breast cancer 08/15/2016   History of COVID-19 05/12/2019   Hypercalcemia 07/19/2015   Malaise and fatigue 07/19/2015    Last Assessment & Plan:  Relevant Hx: Course: Daily Update: Today's Plan:this is off and on for her and mainly with her work that is very stressful for her despite being there 30 years it is still quite hard on her  Electronically signed by: Eleanor Merlynn Lady, NP 07/20/15 1231   Mild intermittent asthma without complication 07/19/2015   Last Assessment & Plan:  Relevant Hx: Course: Daily Update: Today's Plan:this is stable for her at this time and she has inhaler to use if she needs to but she has not had to in some time  Electronically signed by: Eleanor Merlynn Lady, NP 07/20/15 1230   Mixed hyperlipidemia 07/19/2015   Last Assessment & Plan:  Relevant Hx: Course: Daily Update: Today's Plan:she is going to have her lipids checked she cannot take statins due to the fact that she has such severe myalagias with them  Electronically signed by: Eleanor Merlynn Lady, NP 07/20/15 1231   Osteoarthritis of hip 12/30/2018   Personal history of radiation therapy 2018   Left Breast Cancer/ 22 treatments   Post-operative state 07/08/2019   Postoperative examination 12/20/2019   Prediabetes 11/07/2017   Recurrent major depressive disorder 07/19/2015   Last Assessment & Plan:  Relevant Hx: Course: Daily Update: Today's Plan:she feels this is stable for her and will follow  Electronically signed by: Eleanor Merlynn Lady, NP 07/20/15 1231   Special screening examination for viral disease 11/23/2019   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   Qualifier: Diagnosis of  By: Latisha CMA, Leigh     Vitamin D  deficiency 07/19/2015   Last Assessment & Plan:  Relevant Hx: Course: Daily Update: Today's Plan:update her level for her today she is taking her vitamin d  daily now and will continue with this  Electronically signed by: Melissa Joyce Brown-Patram, NP 07/20/15 1232    Medications:  Infusions:   heparin       Assessment: 73 yof presented to the ED with ShOB. Found to have a PE and now starting IV  heparin . Baseline CBC is WNL and she is not on anticoagulation PTA.   Goal of Therapy:  Heparin  level 0.3-0.7 units/ml Monitor platelets by anticoagulation protocol: Yes   Plan:  Heparin  bolus 4500 units IV x 1 Heparin  gtt 1250 units/hr Check an 8 hr heparin  level Daily heparin  level and CBC  Nickolai Rinks, Vernell Helling 03/09/2024,3:08 PM      [1]  Allergies Allergen Reactions   Latex     Caused blisters   Levofloxacin Nausea Only   Statins     Elevated LFT's. Tried Lipitor. States that her liver enzymes went over 500 in 2 days.

## 2024-03-09 NOTE — Progress Notes (Signed)
 Pt ambulated from lobby to CT room and became very SOB and unsteady on her feet.  Pt O2Sat 81% on room air upon lying flat on CT scanning table.  Pt sat up and repositioned to wheelchair where after 5-10 minutes of rest O2 Sats climbed to 89-90% on room ait.  Pt reports she had been SOB for past few weeks and that she has decreased appetite.  Advised pt to reschedule cardiac scan and report to ED for MD evaluation.  Pt verbalized agreement and left department with her husband to seek further evaluation.  RN accompanied pt who remained alert and oriented without further WOB or increase in SOB while in wheelchair to ED triage.  Care transferred to ED staff.

## 2024-03-09 NOTE — ED Provider Notes (Signed)
 " [place] [facility] Provider Note   CSN: [id number] Arrival date & time: 03/09/24  1151     Patient presents with: Shortness of Breath   Melinda Wolfe is a 74 y.o. female.  Patient with past medical history of asthma, obesity, hypertension, hyperlipidemia, coronary artery disease, personal history of breast cancer, depression presents to emergency room today with complaint of shortness of breath.  Patient reports that specifically over the last 3 weeks she has had pretty significant shortness of breath that has progressively worsened. Worse with exertion, can now barely walk across a room without gasping for air. This is associated with intermittent dry cough and intermittent low-grade fever at home.  She reports on Saturday she had fever of 100.5 Fahrenheit. Was 86% RA, now 96% on 3L [place], no O2 at home.  She denies any chest pain or swelling in her lower extremities.  No abdominal pain nausea vomiting diarrhea sore throat runny nose.  She does have past smoking history.    Shortness of Breath      Prior to Admission medications  Medication Sig Start Date End Date Taking? Authorizing Provider  acetaminophen  (TYLENOL ) 325 MG tablet Take 325-650 mg by mouth every 6 (six) hours as needed (for pain.).     [provider]  albuterol  (VENTOLIN  HFA) 108 (90 Base) MCG/ACT inhaler Inhale 1-2 puffs into the lungs every 6 (six) hours as needed for wheezing or shortness of breath. 02/16/24   Adah Corning A, FNP  aspirin EC 81 MG tablet Take 81 mg by mouth daily.    [provider]  metoprolol  tartrate (LOPRESSOR ) 100 MG tablet Take 1 tablet (100 mg total) by mouth once. Take 2 hours prior to your CT if your heart rate is greater than 55 03/01/24 03/08/24  Revankar, Jennifer SAUNDERS, MD  montelukast  (SINGULAIR ) 10 MG tablet Take 10 mg by mouth daily.     [provider]  nitroGLYCERIN  (NITROSTAT ) 0.4 MG SL tablet Place 1 tablet (0.4 mg  total) under the tongue every 5 (five) minutes as needed. 03/01/24 05/30/24  Revankar, Jennifer SAUNDERS, MD  omeprazole (PRILOSEC) 20 MG capsule Take 20 mg by mouth daily.    [provider]  PARoxetine  (PAXIL ) 40 MG tablet Take 40 mg by mouth every morning.    [provider]  Pitavastatin  Calcium  2 MG TABS Take 1 tablet (2 mg total) by mouth daily. 03/01/24   Revankar, Jennifer SAUNDERS, MD    Allergies: Latex, Levofloxacin, and Statins    Review of Systems  Respiratory:  Positive for shortness of breath.     Updated Vital Signs BP 130/67 (BP Location: Left Arm)   Pulse 82   Temp 97.8 F (36.6 C) (Oral)   Resp 20   Ht 5' 5 (1.651 m)   Wt 90.7 kg   SpO2 98%   BMI 33.28 kg/m   Physical Exam Vitals and nursing note reviewed.  Constitutional:      General: She is not in acute distress.    Appearance: She is not toxic-appearing.  HENT:     Head: Normocephalic and atraumatic.  Eyes:     General: No scleral icterus.    Conjunctiva/sclera: Conjunctivae normal.  Cardiovascular:     Rate and Rhythm: Normal rate and regular rhythm.     Pulses: Normal pulses.     Heart sounds: Normal heart sounds.  Pulmonary:     Effort: Pulmonary effort is normal. No respiratory distress.  Breath sounds: Normal breath sounds.     Comments: Mild tachypnea, on 2L [place]. Abdominal:     General: Abdomen is flat. Bowel sounds are normal.     Palpations: Abdomen is soft.     Tenderness: There is no abdominal tenderness.  Musculoskeletal:     Right lower leg: No edema.     Left lower leg: No edema.  Skin:    General: Skin is warm and dry.     Findings: No lesion.  Neurological:     General: No focal deficit present.     Mental Status: She is alert and oriented to person, place, and time. Mental status is at baseline.     (all labs ordered are listed, but only abnormal results are displayed) Labs Reviewed  BASIC METABOLIC PANEL WITH GFR - Abnormal; Notable for the following components:       Result Value   CO2 21 (*)    Glucose, Bld 156 (*)    All other components within normal limits  CBC - Abnormal; Notable for the following components:   WBC 3.7 (*)    All other components within normal limits  LACTIC ACID, PLASMA - Abnormal; Notable for the following components:   Lactic Acid, Venous 2.0 (*)    All other components within normal limits  CULTURE, BLOOD (ROUTINE X 2)  CULTURE, BLOOD (ROUTINE X 2)  LACTIC ACID, PLASMA  PRO BRAIN NATRIURETIC PEPTIDE  HEPARIN  LEVEL (UNFRACTIONATED)  TROPONIN T, HIGH SENSITIVITY    EKG: EKG Interpretation Date/Time:  Tuesday March 09 2024 12:07:30 EST Ventricular Rate:  58 PR Interval:  154 QRS Duration:  132 QT Interval:  437 QTC Calculation: 433 R Axis:   -87  Text Interpretation: Sinus bradycardia Right bundle branch block Confirmed by Jerrol Agent ([id number]) on 03/09/2024 1:46:31 PM  Radiology: CT Angio Chest PE W and/or Wo Contrast Result Date: 03/09/2024 CLINICAL DATA:  Shortness of breath and fever over the weekend. Possible pulmonary embolism. EXAM: CT ANGIOGRAPHY CHEST WITH CONTRAST TECHNIQUE: Multidetector CT imaging of the chest was performed using the standard protocol during bolus administration of intravenous contrast. Multiplanar CT image reconstructions and MIPs were obtained to evaluate the vascular anatomy. RADIATION DOSE REDUCTION: This exam was performed according to the departmental dose-optimization program which includes automated exposure control, adjustment of the mA and/or kV according to patient size and/or use of iterative reconstruction technique. CONTRAST:  75mL OMNIPAQUE  IOHEXOL  350 MG/ML SOLN COMPARISON:  Cardiac CT 08/21/2021 FINDINGS: Cardiovascular: Heart is normal size. Minimal calcified plaque over the left anterior descending and lateral circumflex coronary arteries. Thoracic aorta is normal caliber. Minimal calcified plaque over the descending thoracic aorta. Pulmonary arterial system is well opacified.  Small emboli noted over the proximal right upper lobar pulmonary artery as well as proximal right lower lobar pulmonary arteries. RV/LV ratio is less than 1. Remaining vascular structures are unremarkable. Mediastinum/Nodes: No mediastinal or significant hilar adenopathy. Remaining mediastinal structures are unremarkable. Lungs/Pleura: Lungs are adequately inflated. Very subtle reticular prominence over the subpleural region in the right lower lobe and adjacent the right major fissure likely chronic. 4 mm peripheral nodule over the medial right lower lobe not seen previously. Upper Abdomen: Mild calcified plaque over the abdominal aorta. Previous cholecystectomy. No acute findings over the visualized upper abdominal images. Musculoskeletal: No focal abnormality. Review of the MIP images confirms the above findings. IMPRESSION: 1. Small emboli over the proximal right upper and lower lobar pulmonary arteries. 2. Minimal atherosclerotic coronary artery disease. 3. 4 mm  peripheral nodule over the medial right lower lobe not seen previously. If patient is low risk for malignancy, no routine follow-up imaging is recommended. If patient is high risk for malignancy, a non-contrast chest CT at 12 months is optional.This recommendation follows the consensus statement: Guidelines for Management of Incidental Pulmonary Nodules Detected on CT Images: From the Fleischner Society 2017; Radiology 2017; 284:228-243. 4. Aortic atherosclerosis. Critical Value/emergent results were called by telephone at the time of interpretation on 03/09/2024 at 3:00 pm to provider [facility] , who verbally acknowledged these results. Aortic Atherosclerosis (ICD10-I70.0). Electronically Signed   By: Toribio Agreste M.D.   On: 03/09/2024 15:02   DG Chest 2 View Result Date: 03/09/2024 CLINICAL DATA:  Shortness of breath and fever. EXAM: CHEST - 2 VIEW COMPARISON:  February 16, 2024 FINDINGS: The heart size and mediastinal contours are within normal  limits. Both lungs are clear. Radiopaque surgical clips are seen within the gallbladder fossa. Multilevel degenerative changes are present throughout the thoracic spine. IMPRESSION: No active cardiopulmonary disease. Electronically Signed   By: Suzen Dials M.D.   On: 03/09/2024 13:09     .Critical Care  Performed by: Shermon Warren SAILOR, PA-C Authorized by: Shermon Warren SAILOR, PA-C   Critical care provider statement:    Critical care time (minutes):  50   Critical care was necessary to treat or prevent imminent or life-threatening deterioration of the following conditions:  Respiratory failure   Critical care was time spent personally by me on the following activities:  Development of treatment plan with patient or surrogate, discussions with consultants, evaluation of patient's response to treatment, examination of patient, ordering and review of laboratory studies, ordering and review of radiographic studies, ordering and performing treatments and interventions, pulse oximetry, re-evaluation of patient's condition and review of old charts   Care discussed with: admitting provider      Medications Ordered in the ED  heparin  ADULT infusion 100 units/mL (25000 units/250mL) (1,250 Units/hr Intravenous New Bag/Given 03/09/24 1526)  montelukast  (SINGULAIR ) tablet 10 mg (10 mg Oral Not Given 03/09/24 1529)  pantoprazole  (PROTONIX ) EC tablet 40 mg (40 mg Oral Not Given 03/09/24 1530)  PARoxetine  (PAXIL ) tablet 40 mg (has no administration in time range)  ipratropium-albuterol  (DUONEB) 0.5-2.5 (3) MG/3ML nebulizer solution 3 mL (3 mLs Nebulization Given 03/09/24 1347)  sodium chloride  0.9 % bolus 500 mL (0 mLs Intravenous Stopped 03/09/24 1506)  iohexol  (OMNIPAQUE ) 350 MG/ML injection 100 mL (75 mLs Intravenous Contrast Given 03/09/24 1425)  heparin  bolus via infusion 4,500 Units (4,500 Units Intravenous Bolus from Bag 03/09/24 1527)    Clinical Course as of 03/09/24 1540  Tue Mar 09, 2024  1510 Has two  small PE on CTA, no right heart strain. Will add on troponin. Heparin  ordered. Will page for admission.  [JB]    Clinical Course User Index [JB] Yurem Viner, Warren SAILOR, PA-C                                 Medical Decision Making Amount and/or Complexity of Data Reviewed Labs: ordered. Radiology: ordered.  Risk Prescription drug management. Decision regarding hospitalization.   This patient presents to the ED for concern of shortness of breath, this involves an extensive number of treatment options, and is a complaint that carries with it a high risk of complications and morbidity.  The differential diagnosis includes CHF, PE, pneumonia, pneumothorax, pulmonary embolus, asthma, COPD   Co morbidities that  complicate the patient evaluation  Asthma, obesity, hypertension, hyperlipidemia, coronary artery disease, personal history of breast cancer, depression    Additional history obtained:  Additional history obtained from  Has murmur and worsening SOB saw cardiology on 03/01/2024 and had a Lexi scan and echo scheduled but she was unable to get this done at her appointment today she was found 81% on room air and sent to the ED.   Lab Tests:  I personally interpreted labs.  The pertinent results include:   WBC 3.7, no anemia. BMP unremarkable. Lactic 2 - will give fluids and reassess. Added on Bcx which are pending. Pro BNP WNL    Imaging Studies ordered:  I ordered imaging studies including chest x-ray I independently visualized and interpreted imaging which showed no acute findings, CT angio PE study: 2 right upper lobe PEs with no evidence of right heart strain, pulmonary nodule.  No other acute findings. I agree with the radiologist interpretation   Cardiac Monitoring: / EKG:  The patient was maintained on a cardiac monitor.  I personally viewed and interpreted the cardiac monitored which showed an underlying rhythm of: normal sinus with RBBB   Consultations Obtained:  I  requested consultation with the hospital team for admission,  and discussed lab and imaging findings as well as pertinent plan.   Problem List / ED Course / Critical interventions / Medication management  Patient presents to emergency room with progression of 3 weeks of dyspnea on exertion.  This has now progressed to only being able to tolerate walking distance of the room without needing to stop to take a break. On arrival she was tachypneic and saturating 87% on room air.  Was placed on 3 L nasal cannula with improvement in symptoms.  She does not have any significant wheezing noted on exam of rales.  No bilateral lower extremity swelling.  Her BNP is within normal limits and no sign of congestion on chest x-ray so CHF seems less likely at this time.  She has had some cough with this but had negative respiratory panel COVID and flu yesterday.  No sign of pneumonia on chest x-ray and no elevated white blood cell count.  Will obtain CT PE study to rule out PE, will need admission for new O2 need.   I ordered medication including NS, DuoNeb Reevaluation of the patient after these medicines showed that the patient improved I have reviewed the patients home medicines and have made adjustments as needed Patient's CT angio shows evidence of pulmonary embolism.  Will start heparin  and page for admission.      Final diagnoses:  Acute hypoxic respiratory failure (HCC)  Other acute pulmonary embolism without acute cor pulmonale [facility])    ED Discharge Orders     None          Shermon Warren SAILOR, PA-C 03/09/24 1540    Jerrol Agent, MD 03/09/24 1554  "

## 2024-03-09 NOTE — ED Notes (Signed)
 Called lab to run troponin level off of blood sample sent to lab previously at 1332. Per lab, they will put this into process now.

## 2024-03-09 NOTE — ED Notes (Signed)
 Lab notified of necessity of courier at 2330 for heparin  level

## 2024-03-09 NOTE — ED Triage Notes (Signed)
 Per outpt radiology- pt O2 sats 81% on RA with short distance ambulation, pt rested O2 increased to 89% on RA.

## 2024-03-09 NOTE — ED Notes (Signed)
 Additional light green top tube sent to lab at this time for repeat troponin level. Advised lab of purpose of tube. Per lab, they will put this into process now.

## 2024-03-09 NOTE — Plan of Care (Signed)
 Patient is a 74 year old female with history of asthma, obesity, hypertension, hyperlipidemia, coronary disease, breast cancer, depression who presented with 3 weeks history of shortness of breath, cough.  On presentation, she was hypoxic requiring 2 L of oxygen. Chest x-ray did not show any acute findings.  proBNP, troponin normal.  CT angiogram showed small emboli over the proximal right upper and lower lobar pulmonary arteries.  Started on heparin  drip. Patient being admitted for the management of acute PE, acute hypoxic respiratory failure.

## 2024-03-10 ENCOUNTER — Inpatient Hospital Stay (HOSPITAL_COMMUNITY)

## 2024-03-10 ENCOUNTER — Telehealth (HOSPITAL_COMMUNITY): Payer: Self-pay

## 2024-03-10 ENCOUNTER — Other Ambulatory Visit (HOSPITAL_COMMUNITY): Payer: Self-pay

## 2024-03-10 DIAGNOSIS — R7303 Prediabetes: Secondary | ICD-10-CM | POA: Diagnosis present

## 2024-03-10 DIAGNOSIS — I4510 Unspecified right bundle-branch block: Secondary | ICD-10-CM | POA: Diagnosis present

## 2024-03-10 DIAGNOSIS — Z7982 Long term (current) use of aspirin: Secondary | ICD-10-CM | POA: Diagnosis not present

## 2024-03-10 DIAGNOSIS — I2609 Other pulmonary embolism with acute cor pulmonale: Secondary | ICD-10-CM

## 2024-03-10 DIAGNOSIS — Z86711 Personal history of pulmonary embolism: Secondary | ICD-10-CM | POA: Diagnosis not present

## 2024-03-10 DIAGNOSIS — Z881 Allergy status to other antibiotic agents status: Secondary | ICD-10-CM | POA: Diagnosis not present

## 2024-03-10 DIAGNOSIS — Z91040 Latex allergy status: Secondary | ICD-10-CM | POA: Diagnosis not present

## 2024-03-10 DIAGNOSIS — Z853 Personal history of malignant neoplasm of breast: Secondary | ICD-10-CM | POA: Diagnosis not present

## 2024-03-10 DIAGNOSIS — F339 Major depressive disorder, recurrent, unspecified: Secondary | ICD-10-CM | POA: Diagnosis present

## 2024-03-10 DIAGNOSIS — Z8616 Personal history of COVID-19: Secondary | ICD-10-CM | POA: Diagnosis not present

## 2024-03-10 DIAGNOSIS — E782 Mixed hyperlipidemia: Secondary | ICD-10-CM | POA: Diagnosis present

## 2024-03-10 DIAGNOSIS — E872 Acidosis, unspecified: Secondary | ICD-10-CM | POA: Diagnosis present

## 2024-03-10 DIAGNOSIS — I2699 Other pulmonary embolism without acute cor pulmonale: Secondary | ICD-10-CM | POA: Diagnosis present

## 2024-03-10 DIAGNOSIS — Z96642 Presence of left artificial hip joint: Secondary | ICD-10-CM | POA: Diagnosis present

## 2024-03-10 DIAGNOSIS — I824Y9 Acute embolism and thrombosis of unspecified deep veins of unspecified proximal lower extremity: Secondary | ICD-10-CM | POA: Diagnosis not present

## 2024-03-10 DIAGNOSIS — R001 Bradycardia, unspecified: Secondary | ICD-10-CM | POA: Diagnosis present

## 2024-03-10 DIAGNOSIS — Z87891 Personal history of nicotine dependence: Secondary | ICD-10-CM | POA: Diagnosis not present

## 2024-03-10 DIAGNOSIS — Z8249 Family history of ischemic heart disease and other diseases of the circulatory system: Secondary | ICD-10-CM | POA: Diagnosis not present

## 2024-03-10 DIAGNOSIS — K219 Gastro-esophageal reflux disease without esophagitis: Secondary | ICD-10-CM | POA: Diagnosis present

## 2024-03-10 DIAGNOSIS — J9601 Acute respiratory failure with hypoxia: Secondary | ICD-10-CM | POA: Diagnosis present

## 2024-03-10 DIAGNOSIS — Z923 Personal history of irradiation: Secondary | ICD-10-CM | POA: Diagnosis not present

## 2024-03-10 DIAGNOSIS — Z79899 Other long term (current) drug therapy: Secondary | ICD-10-CM | POA: Diagnosis not present

## 2024-03-10 DIAGNOSIS — I2510 Atherosclerotic heart disease of native coronary artery without angina pectoris: Secondary | ICD-10-CM | POA: Diagnosis present

## 2024-03-10 DIAGNOSIS — Z888 Allergy status to other drugs, medicaments and biological substances status: Secondary | ICD-10-CM | POA: Diagnosis not present

## 2024-03-10 DIAGNOSIS — E669 Obesity, unspecified: Secondary | ICD-10-CM | POA: Diagnosis present

## 2024-03-10 DIAGNOSIS — Z85828 Personal history of other malignant neoplasm of skin: Secondary | ICD-10-CM | POA: Diagnosis not present

## 2024-03-10 LAB — CBC
HCT: 35.8 % — ABNORMAL LOW (ref 36.0–46.0)
Hemoglobin: 11.9 g/dL — ABNORMAL LOW (ref 12.0–15.0)
MCH: 32.3 pg (ref 26.0–34.0)
MCHC: 33.2 g/dL (ref 30.0–36.0)
MCV: 97.3 fL (ref 80.0–100.0)
Platelets: 172 K/uL (ref 150–400)
RBC: 3.68 MIL/uL — ABNORMAL LOW (ref 3.87–5.11)
RDW: 13.2 % (ref 11.5–15.5)
WBC: 2.7 K/uL — ABNORMAL LOW (ref 4.0–10.5)
nRBC: 0 % (ref 0.0–0.2)

## 2024-03-10 LAB — TSH: TSH: 0.878 u[IU]/mL (ref 0.350–4.500)

## 2024-03-10 LAB — BASIC METABOLIC PANEL WITH GFR
Anion gap: 10 (ref 5–15)
BUN: 11 mg/dL (ref 8–23)
CO2: 25 mmol/L (ref 22–32)
Calcium: 9.1 mg/dL (ref 8.9–10.3)
Chloride: 105 mmol/L (ref 98–111)
Creatinine, Ser: 0.72 mg/dL (ref 0.44–1.00)
GFR, Estimated: >60 mL/min
Glucose, Bld: 106 mg/dL — ABNORMAL HIGH (ref 70–99)
Potassium: 3.8 mmol/L (ref 3.5–5.1)
Sodium: 140 mmol/L (ref 135–145)

## 2024-03-10 LAB — ECHOCARDIOGRAM COMPLETE
Area-P 1/2: 3.45 cm2
Calc EF: 72 %
Height: 65 in
S' Lateral: 3.7 cm
Single Plane A2C EF: 69.5 %
Single Plane A4C EF: 73.3 %
Weight: 3294.55 [oz_av]

## 2024-03-10 LAB — HEPARIN LEVEL (UNFRACTIONATED)
Heparin Unfractionated: 0.95 [IU]/mL — ABNORMAL HIGH (ref 0.30–0.70)
Heparin Unfractionated: 0.48 [IU]/mL (ref 0.30–0.70)
Heparin Unfractionated: 0.27 [IU]/mL — ABNORMAL LOW (ref 0.30–0.70)

## 2024-03-10 MED ORDER — NITROGLYCERIN 0.4 MG SL SUBL: 0.4000 mg | SUBLINGUAL_TABLET | SUBLINGUAL | Status: DC | PRN

## 2024-03-10 MED ORDER — ACETAMINOPHEN 325 MG PO TABS
650.0000 mg | ORAL_TABLET | Freq: Four times a day (QID) | ORAL | Status: DC | PRN
  Administered 2024-03-10: 650 mg via ORAL
  Filled 2024-03-10: qty 2

## 2024-03-10 MED ORDER — SODIUM CHLORIDE 0.9 % IV SOLN: INTRAVENOUS | Status: DC

## 2024-03-10 MED ORDER — VITAMIN D 25 MCG (1000 UNIT) PO TABS
1000.0000 [IU] | ORAL_TABLET | Freq: Every day | ORAL | Status: DC
  Administered 2024-03-10 – 2024-03-12 (×3): 1000 [IU] via ORAL
  Filled 2024-03-10 (×3): qty 1

## 2024-03-10 NOTE — Telephone Encounter (Signed)
 Pharmacy Patient Advocate Encounter  Insurance verification completed.    The patient is insured through [facility]. Patient has Medicare and is not eligible for a copay card, but may be able to apply for patient assistance or Medicare RX Payment Plan (Patient Must reach out to their plan, if eligible for payment plan), if available.    Ran test claim for Xarelto  20mg  tablet and the current 30 day co-pay is $216.43 due to deductible.  Ran test claim for Eliquis 5mg  tablet and the current 30 day co-pay is $258.70 due to deductible.  This test claim was processed through [facility]- copay amounts may vary at other pharmacies due to pharmacy/plan contracts, or as the patient moves through the different stages of their insurance plan.

## 2024-03-10 NOTE — Care Management Obs Status (Signed)
 MEDICARE OBSERVATION STATUS NOTIFICATION   Patient Details  Name: Melinda Wolfe MRN: [id number] Date of Birth: 1950/10/10   Medicare Observation Status Notification Given:       Vonzell Arrie Sharps 03/10/2024, 8:55 AM

## 2024-03-10 NOTE — Plan of Care (Signed)
   Problem: Health Behavior/Discharge Planning: Goal: Ability to manage health-related needs will improve Outcome: Progressing   Problem: Clinical Measurements: Goal: Will remain free from infection Outcome: Progressing

## 2024-03-10 NOTE — Progress Notes (Signed)
 PHARMACY - ANTICOAGULATION CONSULT NOTE  Pharmacy Consult for heparin  Indication: pulmonary embolus  Allergies[1]  Patient Measurements: Height: 5' 5 (165.1 cm) Weight: 90.7 kg (200 lb) IBW/kg (Calculated) : 57 HEPARIN  DW (KG): 77.1  Vital Signs: Temp: 97.8 F (36.6 C) (01/06 2330) Temp Source: Oral (01/06 2330) BP: 101/71 (01/06 2330) Pulse Rate: 84 (01/06 2330)  Labs: Recent Labs    03/09/24 1200 03/09/24 2313  HGB 12.8  --   HCT 37.3  --   PLT 201  --   HEPARINUNFRC  --  0.95*  CREATININE 0.83  --     Estimated Creatinine Clearance: 68.2 mL/min (by C-G formula based on SCr of 0.83 mg/dL).  Assessment: Melinda Wolfe presented to the ED with ShOB. Found to have a PE and now starting IV heparin . Baseline CBC is WNL and she is not on anticoagulation PTA.   AM: heparin  level supra-therapeutic on 1250 units/hr (~8h from start). Per RN, no signs/symptoms of bleeding. Level drawn appropriately.  Goal of Therapy:  Heparin  level 0.3-0.7 units/ml Monitor platelets by anticoagulation protocol: Yes   Plan:  Decrease heparin  gtt to 1000 units/hr Check an 8 hr heparin  level Daily heparin  level and CBC  Lynwood Poplar, PharmD, BCPS Clinical Pharmacist 03/10/2024 12:31 AM         [1]  Allergies Allergen Reactions   Latex     Caused blisters   Levofloxacin Nausea Only   Statins     Elevated LFT's. Tried Lipitor. States that her liver enzymes went over 500 in 2 days.

## 2024-03-10 NOTE — Progress Notes (Signed)
 Mobility Specialist Progress Note;   03/10/24 1446  Mobility  Activity Ambulated with assistance  Level of Assistance Contact guard assist, steadying assist  Assistive Device Front wheel walker  Distance Ambulated (ft) 180 ft  Activity Response Tolerated well  Mobility Referral Yes  Mobility visit 1 Mobility  Mobility Specialist Start Time (ACUTE ONLY) 1446  Mobility Specialist Stop Time (ACUTE ONLY) 1508  Mobility Specialist Time Calculation (min) (ACUTE ONLY) 22 min   Pt agreeable to mobility. On 2LO2 upon arrival. Required MinG assistance during ambulation for safety. Ambulated on 2LO2, SPO2 90-95% when able to receive accurate pleth. Took 2x standing rest breaks to check SPO2 pleth, encouraged PLB. Pt returned back to bed and left with all needs met, alarm on.   Lauraine Erm Mobility Specialist Please contact via SecureChat or [employer] [phone]

## 2024-03-10 NOTE — H&P (Signed)
 "                                                                                                          TRH H&P   Patient Demographics:    Melinda Wolfe, is a 74 y.o. female  MRN: [id number]   DOB - 08-Aug-1950  Admit Date - 03/09/2024  Outpatient Primary MD for the patient is Brown-Patram, Eleanor Rung, NP   Chief Complaint  Patient presents with   Shortness of Breath      HPI:    Melinda Wolfe  is a 74 y.o. female, with past medical history of depression, obesity, breast cancer s/p resection and radiation, GERD, and hyperlipidemia, and asthma. - Patient presents to ED secondary to complaints of shortness of breath, patient has been following with her cardiologist over the last few weeks, due to concern for pleuritic chest pain, dyspnea, progressive, mainly on exertion, currently even at rest, she was planned for CT coronaries yesterday morning, where she was noted to be hypoxic and dyspneic while lying on the scan, so she was sent to the nearby emergency room for hypoxia and dyspnea, patient reports symptoms ongoing for few weeks, pleuritic chest pain, she denies any recent prolonged travel, history of smoking or hormone replacement therapy. - In ED she was noted to be hypoxic, dyspneic, CTA chest was significant for PE, for which she was started on anticoagulation and [facility] hospitalist consulted to admit.    Review of systems:     A full 10 point Review of Systems was done, except as stated above, all other Review of Systems were negative.   With Past History of the following :    Past Medical History:  Diagnosis Date   Abnormal CXR 03/30/2016   Abnormal EKG 03/18/2019   Anxiety 07/19/2015   Last Assessment & Plan:  Relevant Hx: Course: Daily Update: Today's Plan:this is stable for her at this time and will follow on her current dose of meds  Electronically signed by: Eleanor Rung Lady, NP 07/20/15 1230   Asthma    uses an inhaler   Biliary dyskinesia 11/23/2019    BMI 34.0-34.9,adult 11/23/2019   BMI 35.0-35.9,adult 11/23/2019   Breast cancer (HCC) 2018   Left Breast Cancer/ 22 radiation treatments   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   Complication of anesthesia    per pt,hard to wake up after sedation!   Depression    Gastroesophageal reflux disease without esophagitis 07/20/2015   Last Assessment & Plan:  Relevant Hx: Course: Daily Update: Today's Plan:she has been stable on the PPI  But she is trying to wean this down to qod which is fine for her to do  Electronically signed by: Eleanor Rung Lady, NP 07/20/15 1235   GERD (gastroesophageal reflux disease)    History of basal cell cancer 04/06/2020   Formatting of this note might be different from the original. Following closely with dermatology   History of breast cancer 08/15/2016   History of COVID-19 05/12/2019   Hypercalcemia 07/19/2015  Malaise and fatigue 07/19/2015   Last Assessment & Plan:  Relevant Hx: Course: Daily Update: Today's Plan:this is off and on for her and mainly with her work that is very stressful for her despite being there 30 years it is still quite hard on her  Electronically signed by: Eleanor Merlynn Lady, NP 07/20/15 1231   Mild intermittent asthma without complication 07/19/2015   Last Assessment & Plan:  Relevant Hx: Course: Daily Update: Today's Plan:this is stable for her at this time and she has inhaler to use if she needs to but she has not had to in some time  Electronically signed by: Eleanor Merlynn Lady, NP 07/20/15 1230   Mixed hyperlipidemia 07/19/2015   Last Assessment & Plan:  Relevant Hx: Course: Daily Update: Today's Plan:she is going to have her lipids checked she cannot take statins due to the fact that she has such severe myalagias with them  Electronically signed by: Eleanor Merlynn Lady, NP 07/20/15 1231   Osteoarthritis of hip 12/30/2018   Personal history of radiation therapy  2018   Left Breast Cancer/ 22 treatments   Post-operative state 07/08/2019   Postoperative examination 12/20/2019   Prediabetes 11/07/2017   Recurrent major depressive disorder 07/19/2015   Last Assessment & Plan:  Relevant Hx: Course: Daily Update: Today's Plan:she feels this is stable for her and will follow  Electronically signed by: Eleanor Merlynn Lady, NP 07/20/15 1231   Special screening examination for viral disease 11/23/2019   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   Qualifier: Diagnosis of  By: Latisha CMA, Leigh     Vitamin D  deficiency 07/19/2015   Last Assessment & Plan:  Relevant Hx: Course: Daily Update: Today's Plan:update her level for her today she is taking her vitamin d  daily now and will continue with this  Electronically signed by: Eleanor Merlynn Lady, NP 07/20/15 1232      Past Surgical History:  Procedure Laterality Date   BREAST BIOPSY Left 04/22/2016   BREAST LUMPECTOMY Left 05/2016   BREAST LUMPECTOMY WITH RADIOACTIVE SEED AND SENTINEL LYMPH NODE BIOPSY Left 05/02/2016   Procedure: BREAST LUMPECTOMY WITH RADIOACTIVE SEED AND SENTINEL LYMPH NODE BIOPSY;  Surgeon: Donnice Bury, MD;  Location: [facility];  Service: General;  Laterality: Left;   COLONOSCOPY     POLYPECTOMY     TOTAL HIP ARTHROPLASTY Left 05/26/2019   Procedure: TOTAL HIP ARTHROPLASTY ANTERIOR APPROACH;  Surgeon: Melodi Lerner, MD;  Location: [facility];  Service: Orthopedics;  Laterality: Left;       Social History:     Social History   Tobacco Use   Smoking status: Former    Current packs/day: 0.00    Types: Cigarettes    Quit date: 08/21/2012    Years since quitting: 11.5   Smokeless tobacco: Never  Substance Use Topics   Alcohol use: Yes    Comment: social        Family History :     Family History  Problem Relation Age of Onset   Colon cancer Mother    Stroke Father    Hypertension Sister    Breast cancer Neg Hx    Colon polyps Neg Hx    Esophageal  cancer Neg Hx    Rectal cancer Neg Hx    Stomach cancer Neg Hx     Home Medications:   Prior to Admission medications  Medication Sig Start Date End Date Taking? Authorizing Provider  acetaminophen  (TYLENOL ) 325 MG tablet Take 325-650 mg by mouth every 6 (six)  hours as needed (for pain.).     [provider]  albuterol  (VENTOLIN  HFA) 108 (90 Base) MCG/ACT inhaler Inhale 1-2 puffs into the lungs every 6 (six) hours as needed for wheezing or shortness of breath. 02/16/24   Adah Corning A, FNP  aspirin EC 81 MG tablet Take 81 mg by mouth daily.    [provider]  metoprolol  tartrate (LOPRESSOR ) 100 MG tablet Take 1 tablet (100 mg total) by mouth once. Take 2 hours prior to your CT if your heart rate is greater than 55 03/01/24 03/08/24  Revankar, Jennifer SAUNDERS, MD  montelukast  (SINGULAIR ) 10 MG tablet Take 10 mg by mouth daily.     [provider]  nitroGLYCERIN  (NITROSTAT ) 0.4 MG SL tablet Place 1 tablet (0.4 mg total) under the tongue every 5 (five) minutes as needed. 03/01/24 05/30/24  Revankar, Jennifer SAUNDERS, MD  omeprazole (PRILOSEC) 20 MG capsule Take 20 mg by mouth daily.    [provider]  PARoxetine  (PAXIL ) 40 MG tablet Take 40 mg by mouth every morning.    [provider]  Pitavastatin  Calcium  2 MG TABS Take 1 tablet (2 mg total) by mouth daily. 03/01/24   Revankar, Jennifer SAUNDERS, MD     Allergies:    Allergies[1]   Physical Exam:   Vitals  Blood pressure (!) 114/56, pulse 63, temperature 98.5 F (36.9 C), temperature source Oral, resp. rate 16, height 5' 5 (1.651 m), weight 93.4 kg, SpO2 94%.   1. General Developed female, laying in bed, no apparent distress  2. Normal affect and insight, Not Suicidal or Homicidal, Awake Alert, Oriented X 3.  3. No F.N deficits, ALL C.Nerves Intact, Strength 5/5 all 4 extremities, Sensation intact all 4 extremities, Plantars down going.  4. Ears and Eyes appear Normal, Conjunctivae clear, PERRLA. Moist Oral  Mucosa.  5. Supple Neck, No JVD, No cervical lymphadenopathy appriciated, No Carotid Bruits.  6. Symmetrical Chest wall movement, Good air movement bilaterally, CTAB.  7. RRR, No Gallops,   8. Positive Bowel Sounds, Abdomen Soft, No tenderness, No organomegaly appriciated,No rebound -guarding or rigidity.  9.  No Cyanosis, Normal Skin Turgor, No Skin Rash or Bruise.  10. Good muscle tone,  joints appear normal , no effusions, Normal ROM.  Has trace right lower extremity edema     Data Review:    CBC Recent Labs  Lab 03/09/24 1200  WBC 3.7*  HGB 12.8  HCT 37.3  PLT 201  MCV 94.9  MCH 32.6  MCHC 34.3  RDW 13.1   ------------------------------------------------------------------------------------------------------------------  Chemistries  Recent Labs  Lab 03/03/24 1008 03/09/24 1200  NA 142 139  K 4.2 3.9  CL 104 103  CO2 23 21*  GLUCOSE 139* 156*  BUN 13 13  CREATININE 0.74 0.83  CALCIUM  9.4 9.6  AST 18  --   ALT 13  --   ALKPHOS 76  --   BILITOT 0.6  --    ------------------------------------------------------------------------------------------------------------------ estimated creatinine clearance is 69.3 mL/min (by C-G formula based on SCr of 0.83 mg/dL). ------------------------------------------------------------------------------------------------------------------ No results for input(s): TSH, T4TOTAL, T3FREE, THYROIDAB in the last 72 hours.  Invalid input(s): FREET3  Coagulation profile No results for input(s): INR, PROTIME in the last 168 hours. ------------------------------------------------------------------------------------------------------------------- No results for input(s): DDIMER in the last 72 hours. -------------------------------------------------------------------------------------------------------------------  Cardiac Enzymes No results for input(s): CKMB, TROPONINI, MYOGLOBIN in the last 168  hours.  Invalid input(s): CK ------------------------------------------------------------------------------------------------------------------ No results found for: BNP   ---------------------------------------------------------------------------------------------------------------  Urinalysis No results  found for: COLORURINE, APPEARANCEUR, LABSPEC, PHURINE, GLUCOSEU, HGBUR, BILIRUBINUR, KETONESUR, PROTEINUR, UROBILINOGEN, NITRITE, LEUKOCYTESUR  ----------------------------------------------------------------------------------------------------------------   Imaging Results:    CT Angio Chest PE W and/or Wo Contrast Result Date: 03/09/2024 CLINICAL DATA:  Shortness of breath and fever over the weekend. Possible pulmonary embolism. EXAM: CT ANGIOGRAPHY CHEST WITH CONTRAST TECHNIQUE: Multidetector CT imaging of the chest was performed using the standard protocol during bolus administration of intravenous contrast. Multiplanar CT image reconstructions and MIPs were obtained to evaluate the vascular anatomy. RADIATION DOSE REDUCTION: This exam was performed according to the departmental dose-optimization program which includes automated exposure control, adjustment of the mA and/or kV according to patient size and/or use of iterative reconstruction technique. CONTRAST:  75mL OMNIPAQUE  IOHEXOL  350 MG/ML SOLN COMPARISON:  Cardiac CT 08/21/2021 FINDINGS: Cardiovascular: Heart is normal size. Minimal calcified plaque over the left anterior descending and lateral circumflex coronary arteries. Thoracic aorta is normal caliber. Minimal calcified plaque over the descending thoracic aorta. Pulmonary arterial system is well opacified. Small emboli noted over the proximal right upper lobar pulmonary artery as well as proximal right lower lobar pulmonary arteries. RV/LV ratio is less than 1. Remaining vascular structures are unremarkable. Mediastinum/Nodes: No mediastinal or significant  hilar adenopathy. Remaining mediastinal structures are unremarkable. Lungs/Pleura: Lungs are adequately inflated. Very subtle reticular prominence over the subpleural region in the right lower lobe and adjacent the right major fissure likely chronic. 4 mm peripheral nodule over the medial right lower lobe not seen previously. Upper Abdomen: Mild calcified plaque over the abdominal aorta. Previous cholecystectomy. No acute findings over the visualized upper abdominal images. Musculoskeletal: No focal abnormality. Review of the MIP images confirms the above findings. IMPRESSION: 1. Small emboli over the proximal right upper and lower lobar pulmonary arteries. 2. Minimal atherosclerotic coronary artery disease. 3. 4 mm peripheral nodule over the medial right lower lobe not seen previously. If patient is low risk for malignancy, no routine follow-up imaging is recommended. If patient is high risk for malignancy, a non-contrast chest CT at 12 months is optional.This recommendation follows the consensus statement: Guidelines for Management of Incidental Pulmonary Nodules Detected on CT Images: From the Fleischner Society 2017; Radiology 2017; 284:228-243. 4. Aortic atherosclerosis. Critical Value/emergent results were called by telephone at the time of interpretation on 03/09/2024 at 3:00 pm to provider [facility] , who verbally acknowledged these results. Aortic Atherosclerosis (ICD10-I70.0). Electronically Signed   By: Toribio Agreste M.D.   On: 03/09/2024 15:02   DG Chest 2 View Result Date: 03/09/2024 CLINICAL DATA:  Shortness of breath and fever. EXAM: CHEST - 2 VIEW COMPARISON:  February 16, 2024 FINDINGS: The heart size and mediastinal contours are within normal limits. Both lungs are clear. Radiopaque surgical clips are seen within the gallbladder fossa. Multilevel degenerative changes are present throughout the thoracic spine. IMPRESSION: No active cardiopulmonary disease. Electronically Signed   By: Suzen Dials M.D.   On: 03/09/2024 13:09    My personal review of EKG: Bradycardia, Rate 58/min, QTc 433, right bundle branch block   Assessment & Plan:    Principal Problem:   Acute pulmonary embolism (HCC) Active Problems:   Mixed hyperlipidemia   Prediabetes   GERD (gastroesophageal reflux disease)   Asthma    Acute respiratory failure with hypoxia Pulmonary embolism - Patient presents with dyspnea, hypoxia 84% on room air, this morning she is requiring 3 L oxygen - This is secondary to pulmonary embolism. - Denies any history of prolonged traveling, non-smoker, not on any hormone replacement  therapy, but she has sedentary lifestyle. - Continue with heparin  drip.  Likely transition to Eliquis in 1 to 2 days-has right lower extremity edema, will check venous Dopplers to rule out DVT. - Will check 2D echo to rule out any right heart strain, even though unlikely with small burden, but it was planned regardless by her outpatient cardiologist for her recurrent chest pain. - Wean oxygen as tolerated  Sinus bradycardia with new right bundle branch block - Was noted on EKG on admission yesterday, but she has just received her metoprolol  as an outpatient in anticipation of CT coronaries. - Heart rate remains on the lower side, as well her blood pressure is on the lower side, I will start on IV fluids. - Check TSH  Lactic acidosis - No evidence of infection, resolved  Chest pain Possible CAD - Patient with sporadic chest pain, been worked up by cardiology as an outpatient, but does appear very likely her chest pain in the setting of PE, has evidence of only mild atherosclerosis in the coronaries, for now I will hold aspirin and on full anticoagulation. - 2D echo is pending. - troponins negative x 2  GERD - Continue with PPI  Hyperlipidemia - Continue with home statin  Asthma -no active wheezing, continue with as needed albuterol  and home Singulair    DVT Prophylaxis Heparin   drip  AM Labs Ordered, also please review Full Orders  Family Communication: Admission, patients condition and plan of care including tests being ordered have been discussed with the patient  who indicate understanding and agree with the plan and Code Status.  Code Status full code  Likely DC to home  Consults called: None  Admission status: Inpatient  Time spent in minutes : 70 minutes   Brayton Lye M.D on 03/10/2024 at 8:09 AM   [facility] - Office  [phone]        [1]  Allergies Allergen Reactions   Latex     Caused blisters   Levofloxacin Nausea Only   Statins     Elevated LFT's. Tried Lipitor. States that her liver enzymes went over 500 in 2 days.   "

## 2024-03-10 NOTE — Progress Notes (Signed)
" °  Echocardiogram 2D Echocardiogram has been performed.  Tinnie FORBES Gosling RDCS 03/10/2024, 1:54 PM "

## 2024-03-10 NOTE — Progress Notes (Addendum)
 PHARMACY - ANTICOAGULATION CONSULT NOTE  Pharmacy Consult for heparin  Indication: pulmonary embolus  Allergies[1]  Patient Measurements: Height: 5' 5 (165.1 cm) Weight: 93.4 kg (205 lb 14.6 oz) IBW/kg (Calculated) : 57 HEPARIN  DW (KG): 77.9  Vital Signs: Temp: 98.3 F (36.8 C) (01/07 0826) Temp Source: Axillary (01/07 0826) BP: 93/46 (01/07 0826) Pulse Rate: 89 (01/07 0826)  Labs: Recent Labs    03/09/24 1200 03/09/24 2313 03/10/24 0759  HGB 12.8  --  11.9*  HCT 37.3  --  35.8*  PLT 201  --  172  HEPARINUNFRC  --  0.95* 0.48  CREATININE 0.83  --   --     Estimated Creatinine Clearance: 69.3 mL/min (by C-G formula based on SCr of 0.83 mg/dL).  Assessment: 22 yof presented to the ED with ShOB. Found to have a PE and on IV heparin . She is not on anticoagulation PTA.   -heparin  level therapeutic on 1000 units/h -CBC stable   Goal of Therapy:  Heparin  level 0.3-0.7 units/ml Monitor platelets by anticoagulation protocol: Yes   Plan:  -Continue heparin  at 1000 units/hr -Confirm heparin  level later today -daily heparin  level and CBC  Prentice Poisson, PharmD Clinical Pharmacist **Pharmacist phone directory can now be found on [email or web address] ([email or web address]).  Listed under [facility].   Addendum -heparin  level= 0.27 on 1000 units/hr  Plan -Increase heparin  to 1100 units/hr -Heparin  level and CBC in am  Prentice Poisson, PharmD Clinical Pharmacist **Pharmacist phone directory can now be found on [email or web address] (PW [email or web address]).  Listed under [facility].         [1]  Allergies Allergen Reactions   Latex     Caused blisters   Levofloxacin Nausea Only   Statins     Elevated LFT's. Tried Lipitor. States that her liver enzymes went over 500 in 2 days.

## 2024-03-10 NOTE — ED Notes (Signed)
 Pharmacy messaged through [facility] regarding adjusting heparin  infusion rate based on recently resulted heparin  level. Pending response or new orders.

## 2024-03-11 ENCOUNTER — Other Ambulatory Visit (HOSPITAL_COMMUNITY): Payer: Self-pay

## 2024-03-11 ENCOUNTER — Inpatient Hospital Stay (HOSPITAL_COMMUNITY)

## 2024-03-11 ENCOUNTER — Telehealth (HOSPITAL_COMMUNITY): Payer: Self-pay

## 2024-03-11 DIAGNOSIS — Z86711 Personal history of pulmonary embolism: Secondary | ICD-10-CM | POA: Diagnosis not present

## 2024-03-11 DIAGNOSIS — J9601 Acute respiratory failure with hypoxia: Secondary | ICD-10-CM

## 2024-03-11 DIAGNOSIS — I2699 Other pulmonary embolism without acute cor pulmonale: Secondary | ICD-10-CM | POA: Diagnosis not present

## 2024-03-11 DIAGNOSIS — I824Y9 Acute embolism and thrombosis of unspecified deep veins of unspecified proximal lower extremity: Secondary | ICD-10-CM | POA: Diagnosis not present

## 2024-03-11 LAB — CBC
HCT: 34.2 % — ABNORMAL LOW (ref 36.0–46.0)
Hemoglobin: 11.5 g/dL — ABNORMAL LOW (ref 12.0–15.0)
MCH: 32.8 pg (ref 26.0–34.0)
MCHC: 33.6 g/dL (ref 30.0–36.0)
MCV: 97.4 fL (ref 80.0–100.0)
Platelets: 166 K/uL (ref 150–400)
RBC: 3.51 MIL/uL — ABNORMAL LOW (ref 3.87–5.11)
RDW: 13 % (ref 11.5–15.5)
WBC: 2.2 K/uL — ABNORMAL LOW (ref 4.0–10.5)
nRBC: 0 % (ref 0.0–0.2)

## 2024-03-11 LAB — BASIC METABOLIC PANEL WITH GFR
Anion gap: 10 (ref 5–15)
BUN: 9 mg/dL (ref 8–23)
CO2: 24 mmol/L (ref 22–32)
Calcium: 9.2 mg/dL (ref 8.9–10.3)
Chloride: 110 mmol/L (ref 98–111)
Creatinine, Ser: 0.62 mg/dL (ref 0.44–1.00)
GFR, Estimated: >60 mL/min
Glucose, Bld: 126 mg/dL — ABNORMAL HIGH (ref 70–99)
Potassium: 3.6 mmol/L (ref 3.5–5.1)
Sodium: 143 mmol/L (ref 135–145)

## 2024-03-11 LAB — HEPARIN LEVEL (UNFRACTIONATED): Heparin Unfractionated: 0.44 [IU]/mL (ref 0.30–0.70)

## 2024-03-11 MED ORDER — APIXABAN 2.5 MG PO TABS: 5.0000 mg | ORAL_TABLET | Freq: Two times a day (BID) | ORAL | Status: DC

## 2024-03-11 MED ORDER — CYANOCOBALAMIN 500 MCG PO TABS
250.0000 ug | ORAL_TABLET | Freq: Every day | ORAL | Status: DC
  Administered 2024-03-11 – 2024-03-12 (×2): 250 ug via ORAL
  Filled 2024-03-11 (×2): qty 1

## 2024-03-11 MED ORDER — APIXABAN 2.5 MG PO TABS
10.0000 mg | ORAL_TABLET | Freq: Two times a day (BID) | ORAL | Status: DC
  Administered 2024-03-11 – 2024-03-12 (×3): 10 mg via ORAL
  Filled 2024-03-11 (×3): qty 4

## 2024-03-11 NOTE — Progress Notes (Addendum)
 PHARMACY - ANTICOAGULATION CONSULT NOTE  Pharmacy Consult for heparin  Indication: pulmonary embolus  Allergies[1]  Patient Measurements: Height: 5' 5 (165.1 cm) Weight: 93.4 kg (205 lb 14.6 oz) IBW/kg (Calculated) : 57 HEPARIN  DW (KG): 77.9  Vital Signs: Temp: 98 F (36.7 C) (01/08 0733) Temp Source: Oral (01/08 0733) BP: 143/77 (01/08 0733) Pulse Rate: 74 (01/08 0733)  Labs: Recent Labs    03/09/24 1200 03/09/24 2313 03/10/24 0759 03/10/24 1234 03/11/24 0350  HGB 12.8  --  11.9*  --  11.5*  HCT 37.3  --  35.8*  --  34.2*  PLT 201  --  172  --  166  HEPARINUNFRC  --    < > 0.48 0.27* 0.44  CREATININE 0.83  --   --  0.72 0.62   < > = values in this interval not displayed.    Estimated Creatinine Clearance: 71.8 mL/min (by C-G formula based on SCr of 0.62 mg/dL).  Assessment: 77 yof presented to the ED with ShOB. Found to have a PE and on IV heparin . She is not on anticoagulation PTA.   -heparin  level therapeutic on 1100 units/h -CBC stable   Goal of Therapy:  Heparin  level 0.3-0.7 units/ml Monitor platelets by anticoagulation protocol: Yes   Plan:  -Continue heparin  at 1100 units/hr -daily heparin  level and CBC  Prentice Poisson, PharmD Clinical Pharmacist **Pharmacist phone directory can now be found on [email or web address] ([email or web address]).  Listed under [facility].   Addendum -Plans to change to apixaban   Plan -apixaban  10mg  po bid for 7 days then 5mg  po bid -Will provide patient education  Prentice Poisson, PharmD Clinical Pharmacist **Pharmacist phone directory can now be found on [email or web address] ([email or web address]).  Listed under [facility].          [1]  Allergies Allergen Reactions   Latex     Caused blisters   Levofloxacin Nausea Only   Statins     Elevated LFT's. Tried Lipitor. States that her liver enzymes went over 500 in 2 days.

## 2024-03-11 NOTE — Progress Notes (Signed)
 Nurse requested Mobility Specialist to perform oxygen saturation test with pt which includes removing pt from oxygen both at rest and while ambulating.  Below are the results from that testing.     Patient Saturations on Room Air at Rest = spO2 95%  Patient Saturations on Room Air while Ambulating = sp02 92% .    Patient Saturations on 0 Liters of oxygen while Ambulating = sp02 92%  At end of testing pt left in room on 0  Liters of oxygen.  Reported results to nurse.    Lauraine Erm Mobility Specialist Please contact via SecureChat or [employer] [phone]

## 2024-03-11 NOTE — Progress Notes (Signed)
 " PROGRESS NOTE    Melinda Wolfe  FMW:[id number] DOB: [birth year] DOA: 03/09/2024 PCP: Benson Eleanor Rung, NP    Brief Narrative:   Melinda Wolfe  is a 74 y.o. female, with past medical history of depression, obesity, breast cancer s/p resection and radiation, GERD, and hyperlipidemia, and asthma. - Patient presents to ED secondary to complaints of shortness of breath, patient has been following with her cardiologist over the last few weeks, due to concern for pleuritic chest pain, dyspnea, progressive, mainly on exertion, currently even at rest, she was planned for CT coronaries yesterday morning, where she was noted to be hypoxic and dyspneic while lying on the scan, so she was sent to the nearby emergency room for hypoxia and dyspnea, patient reports symptoms ongoing for few weeks, pleuritic chest pain, she denies any recent prolonged travel, history of smoking or hormone replacement therapy. - In ED she was noted to be hypoxic, dyspneic, CTA chest was significant for PE, for which she was started on anticoagulation and [facility] hospitalist consulted to admit.   Assessment and Plan: Acute respiratory failure with hypoxia Pulmonary embolism - Patient presents with dyspnea, hypoxia 84% on room air, this morning she is requiring 3 L oxygen - This is secondary to pulmonary embolism. - Denies any history of prolonged traveling, non-smoker, not on any hormone replacement therapy, but she has sedentary lifestyle as she was recently sick - Echo: Left ventricular ejection fraction, by estimation, is 65 to 70%. The  left ventricle has normal function. The left ventricle has no regional  wall motion abnormalities. Left ventricular diastolic parameters are  consistent with Grade I diastolic  dysfunction (impaired relaxation).  - Duplexes pending - Wean oxygen as tolerated     Lactic acidosis - No evidence of infection, resolved   Chest pain Possible CAD - Patient with sporadic chest  pain, been worked up by cardiology as an outpatient, but does appear very likely her chest pain in the setting of PE, has evidence of only mild atherosclerosis in the coronaries, for now I will hold aspirin and on full anticoagulation. - troponins negative x 2   GERD - Continue with PPI   Hyperlipidemia - Continue with home statin   Asthma -no active wheezing, continue with as needed albuterol  and home Singulair    DVT prophylaxis: apixaban  (ELIQUIS ) tablet 5 mg Start: 03/18/24 1000 apixaban  (ELIQUIS ) tablet 10 mg Start: 03/11/24 1145 apixaban  (ELIQUIS ) tablet 10 mg  apixaban  (ELIQUIS ) tablet 5 mg    Code Status: Full Code Family Communication:   Disposition Plan:  Level of care: Telemetry Status is: Inpatient     Consultants:  None   Subjective: Still on oxygen, feeling better than yesterday  Objective: Vitals:   03/11/24 0732 03/11/24 0733 03/11/24 0800 03/11/24 1000  BP:  (!) 143/77    Pulse: 81 74 78 90  Resp: 20 19 17 13   Temp:  98 F (36.7 C)    TempSrc:  Oral    SpO2: 98% 99% 98% 93%  Weight:      Height:        Intake/Output Summary (Last 24 hours) at 03/11/2024 1327 Last data filed at 03/11/2024 0409 Gross per 24 hour  Intake 1217.48 ml  Output 450 ml  Net 767.48 ml   Filed Weights   03/09/24 1158 03/10/24 0621  Weight: 90.7 kg 93.4 kg    Examination:   General: Appearance:    Obese female in no acute distress     Lungs:  respirations unlabored  Heart:    Normal heart rate.    MS:   All extremities are intact.    Neurologic:   Awake, alert       Data Reviewed: I have personally reviewed following labs and imaging studies  CBC: Recent Labs  Lab 03/09/24 1200 03/10/24 0759 03/11/24 0350  WBC 3.7* 2.7* 2.2*  HGB 12.8 11.9* 11.5*  HCT 37.3 35.8* 34.2*  MCV 94.9 97.3 97.4  PLT 201 172 166   Basic Metabolic Panel: Recent Labs  Lab 03/09/24 1200 03/10/24 1234 03/11/24 0350  NA 139 140 143  K 3.9 3.8 3.6  CL 103 105 110   CO2 21* 25 24  GLUCOSE 156* 106* 126*  BUN 13 11 9   CREATININE 0.83 0.72 0.62  CALCIUM  9.6 9.1 9.2   GFR: Estimated Creatinine Clearance: 71.8 mL/min (by C-G formula based on SCr of 0.62 mg/dL). Liver Function Tests: No results for input(s): AST, ALT, ALKPHOS, BILITOT, PROT, ALBUMIN in the last 168 hours. No results for input(s): LIPASE, AMYLASE in the last 168 hours. No results for input(s): AMMONIA in the last 168 hours. Coagulation Profile: No results for input(s): INR, PROTIME in the last 168 hours. Cardiac Enzymes: No results for input(s): CKTOTAL, CKMB, CKMBINDEX, TROPONINI in the last 168 hours. BNP (last 3 results) Recent Labs    03/09/24 1332  PROBNP 52.9   HbA1C: No results for input(s): HGBA1C in the last 72 hours. CBG: No results for input(s): GLUCAP in the last 168 hours. Lipid Profile: No results for input(s): CHOL, HDL, LDLCALC, TRIG, CHOLHDL, LDLDIRECT in the last 72 hours. Thyroid Function Tests: Recent Labs    03/10/24 1234  TSH 0.878   Anemia Panel: No results for input(s): VITAMINB12, FOLATE, FERRITIN, TIBC, IRON, RETICCTPCT in the last 72 hours. Sepsis Labs: Recent Labs  Lab 03/09/24 1201 03/09/24 1511  LATICACIDVEN 2.0* 1.3    Recent Results (from the past 240 hours)  Blood culture (routine x 2)     Status: None (Preliminary result)   Collection Time: 03/09/24  1:13 PM   Specimen: BLOOD  Result Value Ref Range Status   Specimen Description   Final    BLOOD RIGHT ANTECUBITAL Performed at [facility], [address] [place], [place] [address]    Special Requests   Final    BOTTLES DRAWN AEROBIC AND ANAEROBIC Blood Culture adequate volume Performed at [facility], [address] [place], [place] [address]    Culture   Final    NO GROWTH 2 DAYS Performed at [facility], [address] [place], [place] [address]    Report Status PENDING   Incomplete  Blood culture (routine x 2)     Status: None (Preliminary result)   Collection Time: 03/09/24  1:18 PM   Specimen: BLOOD  Result Value Ref Range Status   Specimen Description   Final    BLOOD LEFT ANTECUBITAL Performed at [facility], [address] [place], [place] [address]    Special Requests   Final    BOTTLES DRAWN AEROBIC AND ANAEROBIC Blood Culture adequate volume Performed at [facility], [address] [place], [place] [address]    Culture   Final    NO GROWTH 2 DAYS Performed at [facility], [address] [place], [place] [address]    Report Status PENDING  Incomplete         Radiology Studies:  ECHOCARDIOGRAM COMPLETE Result Date: 03/10/2024    ECHOCARDIOGRAM REPORT   Patient Name:   Melinda Wolfe Date of Exam: 03/10/2024 Medical Rec #:  [id number]               Height:       65.0 in Accession #:    [id number]              Weight:       205.9 lb Date of Birth:  12-24-1950                BSA:          2.003 m Patient Age:    73 years                BP:           122/63 mmHg Patient Gender: F                       HR:           84 bpm. Exam Location:  Inpatient Procedure: 2D Echo, Cardiac Doppler and Color Doppler (Both Spectral and Color            Flow Doppler were utilized during procedure). Indications:    Pulmonary Embolus I26.09  History:        Patient has no prior history of Echocardiogram examinations.                 Risk Factors:Dyslipidemia.  Sonographer:    Tinnie Gosling RDCS Referring Phys: [id number] [address] [place] IMPRESSIONS  1. Left ventricular ejection fraction, by estimation, is 65 to 70%. The left ventricle has normal function. The left ventricle has no regional wall motion abnormalities. Left ventricular diastolic parameters are consistent with Grade I diastolic dysfunction (impaired relaxation).  2. Right ventricular systolic function is normal. The right ventricular size is normal.  3. The mitral valve is  normal in structure. Trivial mitral valve regurgitation. No evidence of mitral stenosis.  4. The aortic valve is tricuspid. There is mild calcification of the aortic valve. Aortic valve regurgitation is not visualized. No aortic stenosis is present.  5. The inferior vena cava is normal in size with greater than 50% respiratory variability, suggesting right atrial pressure of 3 mmHg. FINDINGS  Left Ventricle: Left ventricular ejection fraction, by estimation, is 65 to 70%. The left ventricle has normal function. The left ventricle has no regional wall motion abnormalities. The left ventricular internal cavity size was normal in size. There is  no left ventricular hypertrophy. Left ventricular diastolic parameters are consistent with Grade I diastolic dysfunction (impaired relaxation). Right Ventricle: The right ventricular size is normal. No increase in right ventricular wall thickness. Right ventricular systolic function is normal. Left Atrium: Left atrial size was normal in size. Right Atrium: Right atrial size was normal in size. Pericardium: There is no evidence of pericardial effusion. Mitral Valve: The mitral valve is normal in structure. Trivial mitral valve regurgitation. No evidence of mitral valve stenosis. Tricuspid Valve: The tricuspid valve is normal in structure. Tricuspid valve regurgitation is trivial. No evidence of tricuspid stenosis. Aortic Valve: The aortic valve is tricuspid. There is mild calcification of the aortic valve. Aortic valve regurgitation is not visualized. No aortic stenosis is present. Pulmonic Valve: The pulmonic valve was normal in structure. Pulmonic valve regurgitation is mild. No evidence of pulmonic stenosis. Aorta: The aortic root is normal in size and structure. Venous: The inferior vena  cava is normal in size with greater than 50% respiratory variability, suggesting right atrial pressure of 3 mmHg. IAS/Shunts: No atrial level shunt detected by color flow Doppler.  LEFT  VENTRICLE PLAX 2D LVIDd:         4.90 cm     Diastology LVIDs:         3.70 cm     LV e' medial:    8.27 cm/s LV PW:         0.80 cm     LV E/e' medial:  11.6 LV IVS:        0.90 cm     LV e' lateral:   5.98 cm/s LVOT diam:     2.00 cm     LV E/e' lateral: 16.0 LV SV:         72 LV SV Index:   36 LVOT Area:     3.14 cm LV IVRT:       92 msec  LV Volumes (MOD) LV vol d, MOD A2C: 61.4 ml LV vol d, MOD A4C: 84.6 ml LV vol s, MOD A2C: 18.7 ml LV vol s, MOD A4C: 22.6 ml LV SV MOD A2C:     42.7 ml LV SV MOD A4C:     84.6 ml LV SV MOD BP:      53.4 ml RIGHT VENTRICLE             IVC RV S prime:     10.00 cm/s  IVC diam: 1.90 cm TAPSE (M-mode): 1.6 cm LEFT ATRIUM             Index        RIGHT ATRIUM           Index LA diam:        3.10 cm 1.55 cm/m   RA Area:     12.90 cm LA Vol (A2C):   38.3 ml 19.12 ml/m  RA Volume:   29.50 ml  14.73 ml/m LA Vol (A4C):   39.4 ml 19.67 ml/m LA Biplane Vol: 38.8 ml 19.37 ml/m  AORTIC VALVE LVOT Vmax:   101.00 cm/s LVOT Vmean:  74.500 cm/s LVOT VTI:    0.229 m  AORTA Ao Root diam: 2.90 cm Ao Asc diam:  3.30 cm MITRAL VALVE MV Area (PHT): 3.45 cm     SHUNTS MV Decel Time: 220 msec     Systemic VTI:  0.23 m MV E velocity: 95.90 cm/s   Systemic Diam: 2.00 cm MV A velocity: 114.00 cm/s MV E/A ratio:  0.84 Toribio Fuel MD Electronically signed by Toribio Fuel MD Signature Date/Time: 03/10/2024/2:10:58 PM    Final    CT Angio Chest PE W and/or Wo Contrast Result Date: 03/09/2024 CLINICAL DATA:  Shortness of breath and fever over the weekend. Possible pulmonary embolism. EXAM: CT ANGIOGRAPHY CHEST WITH CONTRAST TECHNIQUE: Multidetector CT imaging of the chest was performed using the standard protocol during bolus administration of intravenous contrast. Multiplanar CT image reconstructions and MIPs were obtained to evaluate the vascular anatomy. RADIATION DOSE REDUCTION: This exam was performed according to the departmental dose-optimization program which includes automated exposure  control, adjustment of the mA and/or kV according to patient size and/or use of iterative reconstruction technique. CONTRAST:  75mL OMNIPAQUE  IOHEXOL  350 MG/ML SOLN COMPARISON:  Cardiac CT 08/21/2021 FINDINGS: Cardiovascular: Heart is normal size. Minimal calcified plaque over the left anterior descending and lateral circumflex coronary arteries. Thoracic aorta is normal caliber. Minimal calcified plaque over the descending thoracic aorta.  Pulmonary arterial system is well opacified. Small emboli noted over the proximal right upper lobar pulmonary artery as well as proximal right lower lobar pulmonary arteries. RV/LV ratio is less than 1. Remaining vascular structures are unremarkable. Mediastinum/Nodes: No mediastinal or significant hilar adenopathy. Remaining mediastinal structures are unremarkable. Lungs/Pleura: Lungs are adequately inflated. Very subtle reticular prominence over the subpleural region in the right lower lobe and adjacent the right major fissure likely chronic. 4 mm peripheral nodule over the medial right lower lobe not seen previously. Upper Abdomen: Mild calcified plaque over the abdominal aorta. Previous cholecystectomy. No acute findings over the visualized upper abdominal images. Musculoskeletal: No focal abnormality. Review of the MIP images confirms the above findings. IMPRESSION: 1. Small emboli over the proximal right upper and lower lobar pulmonary arteries. 2. Minimal atherosclerotic coronary artery disease. 3. 4 mm peripheral nodule over the medial right lower lobe not seen previously. If patient is low risk for malignancy, no routine follow-up imaging is recommended. If patient is high risk for malignancy, a non-contrast chest CT at 12 months is optional.This recommendation follows the consensus statement: Guidelines for Management of Incidental Pulmonary Nodules Detected on CT Images: From the Fleischner Society 2017; Radiology 2017; 284:228-243. 4. Aortic atherosclerosis. Critical  Value/emergent results were called by telephone at the time of interpretation on 03/09/2024 at 3:00 pm to provider [facility] , who verbally acknowledged these results. Aortic Atherosclerosis (ICD10-I70.0). Electronically Signed   By: Toribio Agreste M.D.   On: 03/09/2024 15:02        Scheduled Meds:  apixaban   10 mg Oral BID   Followed by   NOREEN ON 03/18/2024] apixaban   5 mg Oral BID   cholecalciferol   1,000 Units Oral Daily   melatonin  3 mg Oral QHS   montelukast   10 mg Oral Daily   pantoprazole   40 mg Oral Daily   PARoxetine   40 mg Oral Daily   Continuous Infusions:   LOS: 1 day    Time spent: 45 minutes spent on chart review, discussion with nursing staff, consultants, updating family and interview/physical exam; more than 50% of that time was spent in counseling and/or coordination of care.    Harlene RAYMOND Bowl, DO [facility] Available via Epic secure chat 7am-7pm After these hours, please refer to coverage provider listed on [email or web address] 03/11/2024, 1:27 PM   "

## 2024-03-11 NOTE — Progress Notes (Signed)
 Pt weaned of O2 [place]. Saturating in mid and upper 90's on room air.

## 2024-03-11 NOTE — Discharge Instructions (Signed)
 Information on my medicine - ELIQUIS  (apixaban )  Why was Eliquis  prescribed for you? Eliquis  was prescribed to treat blood clots that may have been found in the veins of your legs (deep vein thrombosis) or in your lungs (pulmonary embolism) and to reduce the risk of them occurring again.  What do You need to know about Eliquis  ? The starting dose is 10 mg (two 5 mg tablets) taken TWICE daily for the FIRST SEVEN (7) DAYS, then on (enter date)  03/19/24, the dose is reduced to ONE 5 mg tablet taken TWICE daily.  Eliquis  may be taken with or without food.   Try to take the dose about the same time in the morning and in the evening. If you have difficulty swallowing the tablet whole please discuss with your pharmacist how to take the medication safely.  Take Eliquis  exactly as prescribed and DO NOT stop taking Eliquis  without talking to the doctor who prescribed the medication.  Stopping may increase your risk of developing a new blood clot.  Refill your prescription before you run out.  After discharge, you should have regular check-up appointments with your healthcare provider that is prescribing your Eliquis .    What do you do if you miss a dose? If a dose of ELIQUIS  is not taken at the scheduled time, take it as soon as possible on the same day and twice-daily administration should be resumed. The dose should not be doubled to make up for a missed dose.  Important Safety Information A possible side effect of Eliquis  is bleeding. You should call your healthcare provider right away if you experience any of the following: Bleeding from an injury or your nose that does not stop. Unusual colored urine (red or dark brown) or unusual colored stools (red or black). Unusual bruising for unknown reasons. A serious fall or if you hit your head (even if there is no bleeding).  Some medicines may interact with Eliquis  and might increase your risk of bleeding or clotting while on Eliquis . To  help avoid this, consult your healthcare provider or pharmacist prior to using any new prescription or non-prescription medications, including herbals, vitamins, non-steroidal anti-inflammatory drugs (NSAIDs) and supplements.  This website has more information on Eliquis  (apixaban ): [email or web address] .com/eliquis dena

## 2024-03-11 NOTE — Telephone Encounter (Signed)
 Pharmacy Patient Advocate Encounter  Insurance verification completed.    The patient is insured through [facility]. Patient has Medicare and is not eligible for a copay card, but may be able to apply for patient assistance or Medicare RX Payment Plan (Patient Must reach out to their plan, if eligible for payment plan), if available.    Ran test claim for Eliquis  5mg  tablet and the current 30 day co-pay is $258.70 due to deductible.   This test claim was processed through [facility]- copay amounts may vary at other pharmacies due to pharmacy/plan contracts, or as the patient moves through the different stages of their insurance plan.

## 2024-03-11 NOTE — Progress Notes (Signed)
" °   03/11/24 1619  TOC Brief Assessment  Insurance and Status Reviewed  Patient has primary care physician Yes  Home environment has been reviewed home s/p spouse  Prior level of function: independent  Prior/Current Home Services No current home services  Social Drivers of Health Review SDOH reviewed no interventions necessary  Readmission risk has been reviewed Yes  Transition of care needs no transition of care needs at this time     Pt started on Eliquis , per pharmacy benefit check pt's copay is over $200 due to deductible has not been met yet. Pt is not eligible for copay card, CM spoke with pt at bedside to discuss cost. Pt informed that she can contact her insurance plan to see if she is eligible for Medicare payment plan for drug cost as well as check to see what her copay will be after her deductible has been met.  Info placed on AVS as well to remind pt to contact insurance plan.  Pt denies any HH or DME needs- family will transport home.  "

## 2024-03-11 NOTE — Progress Notes (Signed)
 Bilateral lower extremity venous duplex has been completed. Preliminary results can be found in CV Proc through chart review.  Results were given to Dr. Vann.  03/11/2024 1:40 PM Cathlyn Collet RVT

## 2024-03-11 NOTE — Progress Notes (Signed)
 Mobility Specialist Progress Note;   03/11/24 1058  Mobility  Activity Ambulated with assistance  Level of Assistance Contact guard assist, steadying assist  Assistive Device Front wheel walker  Distance Ambulated (ft) 250 ft  Activity Response Tolerated well  Mobility Referral Yes  Mobility visit 1 Mobility  Mobility Specialist Start Time (ACUTE ONLY) 1058  Mobility Specialist Stop Time (ACUTE ONLY) 1113  Mobility Specialist Time Calculation (min) (ACUTE ONLY) 15 min   Pt eager for mobility. On RA upon arrival. Required MinG assistance during ambulation for safety. Able to ambulate on RA, SPO2 92%> when able to receive accurate pleth. Took 2x brief standing rest breaks to check SPO2. Pt requested to brush teeth at sink once returned to room. Pt returned back to bed and left with all needs met, alarm on.   Lauraine Erm Mobility Specialist Please contact via SecureChat or [employer] [phone]

## 2024-03-12 ENCOUNTER — Other Ambulatory Visit (HOSPITAL_COMMUNITY): Payer: Self-pay

## 2024-03-12 DIAGNOSIS — I824Y9 Acute embolism and thrombosis of unspecified deep veins of unspecified proximal lower extremity: Secondary | ICD-10-CM

## 2024-03-12 DIAGNOSIS — J9601 Acute respiratory failure with hypoxia: Secondary | ICD-10-CM | POA: Diagnosis not present

## 2024-03-12 LAB — CBC
HCT: 33.1 % — ABNORMAL LOW (ref 36.0–46.0)
Hemoglobin: 11.3 g/dL — ABNORMAL LOW (ref 12.0–15.0)
MCH: 32.9 pg (ref 26.0–34.0)
MCHC: 34.1 g/dL (ref 30.0–36.0)
MCV: 96.5 fL (ref 80.0–100.0)
Platelets: 179 K/uL (ref 150–400)
RBC: 3.43 MIL/uL — ABNORMAL LOW (ref 3.87–5.11)
RDW: 12.8 % (ref 11.5–15.5)
WBC: 2.3 K/uL — ABNORMAL LOW (ref 4.0–10.5)
nRBC: 0 % (ref 0.0–0.2)

## 2024-03-12 MED ORDER — APIXABAN (ELIQUIS) VTE STARTER PACK (10MG AND 5MG)
ORAL_TABLET | ORAL | 0 refills | Status: DC
  Filled 2024-03-12: qty 74, 30d supply, fill #0

## 2024-03-12 MED ORDER — ALBUTEROL SULFATE HFA 108 (90 BASE) MCG/ACT IN AERS
1.0000 | INHALATION_SPRAY | Freq: Four times a day (QID) | RESPIRATORY_TRACT | 1 refills | Status: AC | PRN
  Filled 2024-03-12: qty 6.7, 25d supply, fill #0

## 2024-03-12 NOTE — Discharge Summary (Signed)
 "     Physician Discharge Summary  Melinda Wolfe FMW:[id number] DOB: 1950/10/25 DOA: 03/09/2024  PCP: Benson Eleanor Rung, NP  Admit date: 03/09/2024 Discharge date: 03/12/2024  Admitted From:  Discharge disposition: Home   Recommendations for Outpatient Follow-Up:   New medication: Eliquis -patient to reach out regarding assistance if her deductible has not been met Referral DVT clinic Patient is been discharged on 1 L O2-suspect she can be weaned off of this-would get PFTs as patient has been complaining of shortness of breath for a while and not sure the PE is the only issue causing her need for oxygen   Discharge Diagnosis:   Principal Problem:   Acute pulmonary embolism (HCC) Active Problems:   Mixed hyperlipidemia   Prediabetes   GERD (gastroesophageal reflux disease)   Asthma    Discharge Condition: Improved.  Diet recommendation: Low sodium, heart healthy.  Carbohydrate-modified.  Regular.  Wound care: None.  Code status: Full.   History of Present Illness:    Melinda Wolfe  is a 74 y.o. female, with past medical history of depression, obesity, breast cancer s/p resection and radiation, GERD, and hyperlipidemia, and asthma. - Patient presents to ED secondary to complaints of shortness of breath, patient has been following with her cardiologist over the last few weeks, due to concern for pleuritic chest pain, dyspnea, progressive, mainly on exertion, currently even at rest, she was planned for CT coronaries yesterday morning, where she was noted to be hypoxic and dyspneic while lying on the scan, so she was sent to the nearby emergency room for hypoxia and dyspnea, patient reports symptoms ongoing for few weeks, pleuritic chest pain, she denies any recent prolonged travel, history of smoking or hormone replacement therapy. - In ED she was noted to be hypoxic, dyspneic, CTA chest was significant for PE, for which she was started on anticoagulation and  [facility] consulted to admit.       Hospital Course by Problem:   Acute respiratory failure with hypoxia Pulmonary embolism/DVT - Denies any history of prolonged traveling, non-smoker, not on any hormone replacement therapy, but she has sedentary lifestyle as she was recently sick - Echo: Left ventricular ejection fraction, by estimation, is 65 to 70%. The  left ventricle has normal function. The left ventricle has no regional  wall motion abnormalities. Left ventricular diastolic parameters are  consistent with Grade I diastolic  dysfunction (impaired relaxation).  - Duplexes  positive for DVT - Patient was unable to be weaned to room air so we will send home on 1 L with exertion-the patient is been short of breath prior to PE so wonder if there is another underlying lung issue-would refer to pulmonary/get PFTs outpatient once recovered     Lactic acidosis - No evidence of infection, resolved   Chest pain Possible CAD - Patient with sporadic chest pain, been worked up by cardiology as an outpatient, but does appear very likely her chest pain in the setting of PE, has evidence of only mild atherosclerosis in the coronaries, for now I will hold aspirin and on full anticoagulation. - troponins negative x 2   GERD - Continue with PPI   Hyperlipidemia - Continue with home statin   Asthma -no active wheezing, continue with as needed albuterol  and home Singulair     Medical Consultants:    None  Discharge Exam:   Vitals:   03/12/24 0418 03/12/24 0849  BP: 138/70 137/81  Pulse: 82 91  Resp: 20 15  Temp: 97.8  F (36.6 C) 98 F (36.7 C)  SpO2: 94% 96%   Vitals:   03/11/24 2037 03/11/24 2352 03/12/24 0418 03/12/24 0849  BP: 135/81 132/72 138/70 137/81  Pulse: 95 85 82 91  Resp: 20 20 20 15   Temp: 98.2 F (36.8 C) 98.3 F (36.8 C) 97.8 F (36.6 C) 98 F (36.7 C)  TempSrc: Oral Oral Oral Oral  SpO2: 91% 90% 94% 96%  Weight:      Height:        General  exam: Appears calm and comfortable.   The results of significant diagnostics from this hospitalization (including imaging, microbiology, ancillary and laboratory) are listed below for reference.     Procedures and Diagnostic Studies:   ECHOCARDIOGRAM COMPLETE Result Date: 03/10/2024    ECHOCARDIOGRAM REPORT   Patient Name:   Melinda Wolfe Bracco Date of Exam: 03/10/2024 Medical Rec #:  [id number]               Height:       65.0 in Accession #:    [id number]              Weight:       205.9 lb Date of Birth:  1951-01-01                BSA:          2.003 m Patient Age:    73 years                BP:           122/63 mmHg Patient Gender: F                       HR:           84 bpm. Exam Location:  Inpatient Procedure: 2D Echo, Cardiac Doppler and Color Doppler (Both Spectral and Color            Flow Doppler were utilized during procedure). Indications:    Pulmonary Embolus I26.09  History:        Patient has no prior history of Echocardiogram examinations.                 Risk Factors:Dyslipidemia.  Sonographer:    Tinnie Gosling RDCS Referring Phys: 19 DAWOOD S ELGERGAWY IMPRESSIONS  1. Left ventricular ejection fraction, by estimation, is 65 to 70%. The left ventricle has normal function. The left ventricle has no regional wall motion abnormalities. Left ventricular diastolic parameters are consistent with Grade I diastolic dysfunction (impaired relaxation).  2. Right ventricular systolic function is normal. The right ventricular size is normal.  3. The mitral valve is normal in structure. Trivial mitral valve regurgitation. No evidence of mitral stenosis.  4. The aortic valve is tricuspid. There is mild calcification of the aortic valve. Aortic valve regurgitation is not visualized. No aortic stenosis is present.  5. The inferior vena cava is normal in size with greater than 50% respiratory variability, suggesting right atrial pressure of 3 mmHg. FINDINGS  Left Ventricle: Left ventricular ejection  fraction, by estimation, is 65 to 70%. The left ventricle has normal function. The left ventricle has no regional wall motion abnormalities. The left ventricular internal cavity size was normal in size. There is  no left ventricular hypertrophy. Left ventricular diastolic parameters are consistent with Grade I diastolic dysfunction (impaired relaxation). Right Ventricle: The right ventricular size is normal. No increase in right ventricular wall thickness. Right ventricular systolic function  is normal. Left Atrium: Left atrial size was normal in size. Right Atrium: Right atrial size was normal in size. Pericardium: There is no evidence of pericardial effusion. Mitral Valve: The mitral valve is normal in structure. Trivial mitral valve regurgitation. No evidence of mitral valve stenosis. Tricuspid Valve: The tricuspid valve is normal in structure. Tricuspid valve regurgitation is trivial. No evidence of tricuspid stenosis. Aortic Valve: The aortic valve is tricuspid. There is mild calcification of the aortic valve. Aortic valve regurgitation is not visualized. No aortic stenosis is present. Pulmonic Valve: The pulmonic valve was normal in structure. Pulmonic valve regurgitation is mild. No evidence of pulmonic stenosis. Aorta: The aortic root is normal in size and structure. Venous: The inferior vena cava is normal in size with greater than 50% respiratory variability, suggesting right atrial pressure of 3 mmHg. IAS/Shunts: No atrial level shunt detected by color flow Doppler.  LEFT VENTRICLE PLAX 2D LVIDd:         4.90 cm     Diastology LVIDs:         3.70 cm     LV e' medial:    8.27 cm/s LV PW:         0.80 cm     LV E/e' medial:  11.6 LV IVS:        0.90 cm     LV e' lateral:   5.98 cm/s LVOT diam:     2.00 cm     LV E/e' lateral: 16.0 LV SV:         72 LV SV Index:   36 LVOT Area:     3.14 cm LV IVRT:       92 msec  LV Volumes (MOD) LV vol d, MOD A2C: 61.4 ml LV vol d, MOD A4C: 84.6 ml LV vol s, MOD A2C: 18.7  ml LV vol s, MOD A4C: 22.6 ml LV SV MOD A2C:     42.7 ml LV SV MOD A4C:     84.6 ml LV SV MOD BP:      53.4 ml RIGHT VENTRICLE             IVC RV S prime:     10.00 cm/s  IVC diam: 1.90 cm TAPSE (M-mode): 1.6 cm LEFT ATRIUM             Index        RIGHT ATRIUM           Index LA diam:        3.10 cm 1.55 cm/m   RA Area:     12.90 cm LA Vol (A2C):   38.3 ml 19.12 ml/m  RA Volume:   29.50 ml  14.73 ml/m LA Vol (A4C):   39.4 ml 19.67 ml/m LA Biplane Vol: 38.8 ml 19.37 ml/m  AORTIC VALVE LVOT Vmax:   101.00 cm/s LVOT Vmean:  74.500 cm/s LVOT VTI:    0.229 m  AORTA Ao Root diam: 2.90 cm Ao Asc diam:  3.30 cm MITRAL VALVE MV Area (PHT): 3.45 cm     SHUNTS MV Decel Time: 220 msec     Systemic VTI:  0.23 m MV E velocity: 95.90 cm/s   Systemic Diam: 2.00 cm MV A velocity: 114.00 cm/s MV E/A ratio:  0.84 Toribio Fuel MD Electronically signed by Toribio Fuel MD Signature Date/Time: 03/10/2024/2:10:58 PM    Final    CT Angio Chest PE W and/or Wo Contrast Result Date: 03/09/2024 CLINICAL DATA:  Shortness of breath and  fever over the weekend. Possible pulmonary embolism. EXAM: CT ANGIOGRAPHY CHEST WITH CONTRAST TECHNIQUE: Multidetector CT imaging of the chest was performed using the standard protocol during bolus administration of intravenous contrast. Multiplanar CT image reconstructions and MIPs were obtained to evaluate the vascular anatomy. RADIATION DOSE REDUCTION: This exam was performed according to the departmental dose-optimization program which includes automated exposure control, adjustment of the mA and/or kV according to patient size and/or use of iterative reconstruction technique. CONTRAST:  75mL OMNIPAQUE  IOHEXOL  350 MG/ML SOLN COMPARISON:  Cardiac CT 08/21/2021 FINDINGS: Cardiovascular: Heart is normal size. Minimal calcified plaque over the left anterior descending and lateral circumflex coronary arteries. Thoracic aorta is normal caliber. Minimal calcified plaque over the descending thoracic  aorta. Pulmonary arterial system is well opacified. Small emboli noted over the proximal right upper lobar pulmonary artery as well as proximal right lower lobar pulmonary arteries. RV/LV ratio is less than 1. Remaining vascular structures are unremarkable. Mediastinum/Nodes: No mediastinal or significant hilar adenopathy. Remaining mediastinal structures are unremarkable. Lungs/Pleura: Lungs are adequately inflated. Very subtle reticular prominence over the subpleural region in the right lower lobe and adjacent the right major fissure likely chronic. 4 mm peripheral nodule over the medial right lower lobe not seen previously. Upper Abdomen: Mild calcified plaque over the abdominal aorta. Previous cholecystectomy. No acute findings over the visualized upper abdominal images. Musculoskeletal: No focal abnormality. Review of the MIP images confirms the above findings. IMPRESSION: 1. Small emboli over the proximal right upper and lower lobar pulmonary arteries. 2. Minimal atherosclerotic coronary artery disease. 3. 4 mm peripheral nodule over the medial right lower lobe not seen previously. If patient is low risk for malignancy, no routine follow-up imaging is recommended. If patient is high risk for malignancy, a non-contrast chest CT at 12 months is optional.This recommendation follows the consensus statement: Guidelines for Management of Incidental Pulmonary Nodules Detected on CT Images: From the Fleischner Society 2017; Radiology 2017; 284:228-243. 4. Aortic atherosclerosis. Critical Value/emergent results were called by telephone at the time of interpretation on 03/09/2024 at 3:00 pm to provider [facility] , who verbally acknowledged these results. Aortic Atherosclerosis (ICD10-I70.0). Electronically Signed   By: Toribio Agreste M.D.   On: 03/09/2024 15:02   DG Chest 2 View Result Date: 03/09/2024 CLINICAL DATA:  Shortness of breath and fever. EXAM: CHEST - 2 VIEW COMPARISON:  February 16, 2024 FINDINGS: The  heart size and mediastinal contours are within normal limits. Both lungs are clear. Radiopaque surgical clips are seen within the gallbladder fossa. Multilevel degenerative changes are present throughout the thoracic spine. IMPRESSION: No active cardiopulmonary disease. Electronically Signed   By: Suzen Dials M.D.   On: 03/09/2024 13:09     Labs:   Basic Metabolic Panel: Recent Labs  Lab 03/09/24 1200 03/10/24 1234 03/11/24 0350  NA 139 140 143  K 3.9 3.8 3.6  CL 103 105 110  CO2 21* 25 24  GLUCOSE 156* 106* 126*  BUN 13 11 9   CREATININE 0.83 0.72 0.62  CALCIUM  9.6 9.1 9.2   GFR Estimated Creatinine Clearance: 71.8 mL/min (by C-G formula based on SCr of 0.62 mg/dL). Liver Function Tests: No results for input(s): AST, ALT, ALKPHOS, BILITOT, PROT, ALBUMIN in the last 168 hours. No results for input(s): LIPASE, AMYLASE in the last 168 hours. No results for input(s): AMMONIA in the last 168 hours. Coagulation profile No results for input(s): INR, PROTIME in the last 168 hours.  CBC: Recent Labs  Lab 03/09/24 1200 03/10/24 0759 03/11/24  0350 03/12/24 0404  WBC 3.7* 2.7* 2.2* 2.3*  HGB 12.8 11.9* 11.5* 11.3*  HCT 37.3 35.8* 34.2* 33.1*  MCV 94.9 97.3 97.4 96.5  PLT 201 172 166 179   Cardiac Enzymes: No results for input(s): CKTOTAL, CKMB, CKMBINDEX, TROPONINI in the last 168 hours. BNP: Invalid input(s): POCBNP CBG: No results for input(s): GLUCAP in the last 168 hours. D-Dimer No results for input(s): DDIMER in the last 72 hours. Hgb A1c No results for input(s): HGBA1C in the last 72 hours. Lipid Profile No results for input(s): CHOL, HDL, LDLCALC, TRIG, CHOLHDL, LDLDIRECT in the last 72 hours. Thyroid function studies Recent Labs    03/10/24 1234  TSH 0.878   Anemia work up No results for input(s): VITAMINB12, FOLATE, FERRITIN, TIBC, IRON, RETICCTPCT in the last 72  hours. Microbiology Recent Results (from the past 240 hours)  Blood culture (routine x 2)     Status: None (Preliminary result)   Collection Time: 03/09/24  1:13 PM   Specimen: BLOOD  Result Value Ref Range Status   Specimen Description   Final    BLOOD RIGHT ANTECUBITAL Performed at [facility], [address] [place], [place] [address]    Special Requests   Final    BOTTLES DRAWN AEROBIC AND ANAEROBIC Blood Culture adequate volume Performed at [facility], [address] [place], [place] [address]    Culture   Final    NO GROWTH 3 DAYS Performed at [facility], [address] [place], [place] [address]    Report Status PENDING  Incomplete  Blood culture (routine x 2)     Status: None (Preliminary result)   Collection Time: 03/09/24  1:18 PM   Specimen: BLOOD  Result Value Ref Range Status   Specimen Description   Final    BLOOD LEFT ANTECUBITAL Performed at [facility], [address] [place], [place] [address]    Special Requests   Final    BOTTLES DRAWN AEROBIC AND ANAEROBIC Blood Culture adequate volume Performed at [facility], [address] [place], [place] [address]    Culture   Final    NO GROWTH 3 DAYS Performed at [facility], [address] [place], [place] [address]    Report Status PENDING  Incomplete     Discharge Instructions:   Discharge Instructions     AMB Referral to Deep Vein Thrombosis Clinic   Complete by: As directed    Is patient currently on anticoagulation?: Yes   Diet general   Complete by: As directed    Increase activity slowly   Complete by: As directed       Allergies as of 03/12/2024       Reactions   Latex    Caused blisters   Levofloxacin Nausea Only   Statins    Elevated LFT's. Tried Lipitor. States that her liver enzymes went over 500 in 2 days.        Medication List     PAUSE taking these medications    aspirin EC 81 MG tablet Wait to take  this until your doctor or other care provider tells you to start again. Take 81 mg by mouth daily.       TAKE these medications    acetaminophen  325 MG tablet Commonly known as: TYLENOL  Take 325-650 mg by mouth every 6 (six) hours as needed (for pain.).   albuterol  108 (90 Base) MCG/ACT  inhaler Commonly known as: VENTOLIN  HFA Inhale 1-2 puffs into the lungs every 6 (six) hours as needed for wheezing or shortness of breath.   Apixaban  Starter Pack (10mg  and 5mg ) Commonly known as: ELIQUIS  STARTER PACK Take as directed on package: start with two-5mg  tablets twice daily for 7 days. On day 8, switch to one-5mg  tablet twice daily.   cholecalciferol  25 MCG (1000 UNIT) tablet Commonly known as: VITAMIN D3 Take 1,000 Units by mouth daily.   montelukast  10 MG tablet Commonly known as: SINGULAIR  Take 10 mg by mouth daily.   nitroGLYCERIN  0.4 MG SL tablet Commonly known as: NITROSTAT  Place 1 tablet (0.4 mg total) under the tongue every 5 (five) minutes as needed.   omeprazole 20 MG capsule Commonly known as: PRILOSEC Take 20 mg by mouth daily.   PARoxetine  40 MG tablet Commonly known as: PAXIL  Take 40 mg by mouth every morning.   vitamin B-12 250 MCG tablet Commonly known as: CYANOCOBALAMIN  Take 250 mcg by mouth daily.               Durable Medical Equipment  (From admission, onward)           Start     Ordered   03/12/24 0948  For home use only DME oxygen  Once       Question Answer Comment  Length of Need 6 Months   Mode or (Route) Nasal cannula   Liters per Minute 1   Oxygen delivery system: Gas      03/12/24 0948            Contact information for follow-up providers     [employer] Follow up.   Why: Call to follow up what your co-pay cost will be for Eliquis  after your deductible is met, if needed you can also ask your plan if you are eligible for a Medicare RX Payment Plan to assist with cost. Contact information: toll  free # on back of insurance card for either customer service or Pharmacy benefits.             Contact information for after-discharge care     Durable Medical Equipment     [facility] (DME) Follow up.   Service: Durable Medical Equipment Why: Home 02 arranged- they will provide transport 02 on discharge and deliver home concentrator to the home Contact information: [address] [facility] [place]  [address] [phone]                      Time coordinating discharge: 45 min  Signed:  Harlene RAYMOND Bowl DO  [facility] 03/12/2024, 1:14 PM      "

## 2024-03-12 NOTE — TOC Transition Note (Signed)
 Transition of Care (TOC) - Discharge Note Rayfield Gobble RN, BSN Inpatient Care Management Unit 4E- RN Case Manager See Treatment Team for direct phone #   Patient Details  Name: Melinda Wolfe MRN: [id number] Date of Birth: [birth year]  Transition of Care [facility]) CM/SW Contact:  Gobble Rayfield Hurst, RN Phone Number: 03/12/2024, 12:29 PM   Clinical Narrative:    Orders placed for home 02- CM spoke with pt at bedside- choice offered for DME provider- pt voiced she had no proference in provider for home 02,   Referral placed in [facility] and call made to liaison for home 02 needs to [facility]- portable 02 will be delivered to bedside for transport home, [facility] will deliver home equipment later to the home.   IP CM interventions have been completed no further needs noted. Anticipate d/c later today.    Final next level of care: Home/Self Care Barriers to Discharge: No Barriers Identified   Patient Goals and CMS Choice Patient states their goals for this hospitalization and ongoing recovery are:: return home   Choice offered to / list presented to : Patient      Discharge Placement                 Home      Discharge Plan and Services Additional resources added to the After Visit Summary for     Discharge Planning Services: CM Consult Post Acute Care Choice: Durable Medical Equipment          DME Arranged: Oxygen DME Agency: [facility] Date DME Agency Contacted: 03/12/24 Time DME Agency Contacted: 1022 Representative spoke with at DME Agency: [facility] HH Arranged: NA HH Agency: NA        Social Drivers of Health (SDOH) Interventions SDOH Screenings   Food Insecurity: No Food Insecurity (03/10/2024)  Housing: Low Risk (03/10/2024)  Transportation Needs: No Transportation Needs (03/10/2024)  Utilities: Not At Risk (03/10/2024)  Social Connections: Socially Integrated (03/10/2024)  Tobacco Use: Medium Risk (03/09/2024)     Readmission Risk Interventions     03/12/2024   10:22 AM  Readmission Risk Prevention Plan  Post Dischage Appt Complete  Medication Screening Complete  Transportation Screening Complete

## 2024-03-12 NOTE — Plan of Care (Signed)

## 2024-03-12 NOTE — Progress Notes (Signed)
 Mobility Specialist Progress Note;   03/12/24 0913  Mobility  Activity Ambulated with assistance  Level of Assistance Standby assist, set-up cues, supervision of patient - no hands on  Assistive Device Front wheel walker  Distance Ambulated (ft) 250 ft  Activity Response Tolerated well  Mobility Referral Yes  Mobility visit 1 Mobility  Mobility Specialist Start Time (ACUTE ONLY) 0913  Mobility Specialist Stop Time (ACUTE ONLY) 0940  Mobility Specialist Time Calculation (min) (ACUTE ONLY) 27 min   Pt agreeable to mobility. On 1LO2 upon arrival. Attempted ambulation on RA for 2x bouts, however when able to receive accurate pleth on SPO2 monitor, SPO2 read as low as 85% on RA with c/o SOB. Placed 1 LO2 on pt, SPO2 able to maintain 92-95%. Pt feeling more comfortable w/ supplemental O2 placed. Required no physical assistance during ambulation. Took 2x standing rest breaks d/t pt c/o SOB. Pt returned back to bed and left with all needs met, on 1LO2.   Lauraine Erm Mobility Specialist Please contact via SecureChat or [employer] [phone]

## 2024-03-12 NOTE — Progress Notes (Signed)
 Nurse requested Mobility Specialist to perform oxygen saturation test with pt which includes removing pt from oxygen both at rest and while ambulating.  Below are the results from that testing.     Patient Saturations on Room Air at Rest = spO2 94%  Patient Saturations on Room Air while Ambulating = sp02 85% .    Patient Saturations on 1 Liters of oxygen while Ambulating = sp02 93%  At end of testing pt left in room on 1  Liters of oxygen.  Reported results to nurse.    Lauraine Erm Mobility Specialist Please contact via SecureChat or [employer] [phone]

## 2024-03-14 LAB — CULTURE, BLOOD (ROUTINE X 2)
Culture: NO GROWTH
Culture: NO GROWTH
Special Requests: ADEQUATE
Special Requests: ADEQUATE

## 2024-03-15 ENCOUNTER — Telehealth: Payer: Self-pay | Admitting: Cardiology

## 2024-03-15 NOTE — Telephone Encounter (Signed)
 Pt c/o medication issue:  1. Name of Medication:  Pitvastatin 2 mg daily   2. How are you currently taking this medication (dosage and times per day)? No  3. Are you having a reaction (difficulty breathing--STAT)? No  4. What is your medication issue? Patient stated she was told to start this medication after her 03/01/24 visit but has not received the prescription and is wondering if she still needs to start the medication. Patient is wondering if her labs and echo during the 03/09/24 came back and if someone can go over them with her and if they can be used for her upcoming appointments.

## 2024-03-16 ENCOUNTER — Other Ambulatory Visit: Payer: Self-pay

## 2024-03-16 ENCOUNTER — Other Ambulatory Visit: Payer: Self-pay | Admitting: Cardiology

## 2024-03-16 MED ORDER — PITAVASTATIN CALCIUM 2 MG PO TABS: 2.0000 mg | ORAL_TABLET | Freq: Every day | ORAL | 0 refills | Status: DC

## 2024-03-16 NOTE — Telephone Encounter (Signed)
 Called the patient and informed her that her Pitavastatin  had been sent to the [facility] pharmacy listed on her chart. She asked that only a 30 day supply be sent in because she had difficulty before taking statin medication. I explained that a 30 day supply had been sent in to her [facility] pharmacy. Patient verbalized understanding.

## 2024-03-16 NOTE — Telephone Encounter (Signed)
 Patient was asking about the results of her echo and lab work that she had done while in the hospital. I explained that I did not see in the chart where these test had been interpreted at this time. I explained that I would send Dr. Edwyna a message regarding these test. Patient verbalized understanding and had no further questions at this time.

## 2024-03-17 NOTE — Telephone Encounter (Signed)
 Pt has an appointment with you 2/3. Recently in the hospital and had an echo and DVT study (in [facility]).

## 2024-03-19 ENCOUNTER — Telehealth: Payer: Self-pay | Admitting: Cardiology

## 2024-03-19 NOTE — Telephone Encounter (Signed)
 Pt c/o medication issue:  1. Name of Medication:   Pitavastatin  Calcium  2 MG TABS    2. How are you currently taking this medication (dosage and times per day)?  TAKE 1 TABLET(2 MG) BY MOUTH DAILY       3. Are you having a reaction (difficulty breathing--STAT)? No  4. What is your medication issue? Pt is requesting a callback regarding this medication being $429.00 with insurance. She stated it's too expensive and she'd like to discuss other options. Please advise.

## 2024-03-23 ENCOUNTER — Telehealth: Payer: Self-pay | Admitting: Pharmacy Technician

## 2024-03-23 ENCOUNTER — Other Ambulatory Visit (HOSPITAL_COMMUNITY): Payer: Self-pay

## 2024-03-23 NOTE — Telephone Encounter (Signed)
 Ran test claim for eliquis . For a 30 day supply and the co-pay is 127.05 . PA is not needed at this time. This test claim was processed through [facility]- copay amounts may vary at other pharmacies due to pharmacy/plan contracts, or as the patient moves through the different stages of their insurance plan.       Then copay will be 43.88  She doesn't have cardiomyopathy

## 2024-03-23 NOTE — Telephone Encounter (Signed)
" ° °  I called [facility] [phone] for 90 days says going towards deductible and doesn't say how much she has left of her deductible. I ran a test claim of pitavastatin  4mg  for 30 days and it was 63.17.   Patient Advocate Encounter   The patient was approved for a [employer] grant that will help cover the cost of pitavastatin  Total amount awarded, 2500.  Effective: 02/22/24 - 02/20/25   APW:[id number] ERW:[id number] Hmnle:[id number] PI:[id number] [employer] ID: [id number]   Pharmacy provided with approval and processing information. Patient informed via mychart   "

## 2024-03-25 ENCOUNTER — Ambulatory Visit: Attending: Cardiology

## 2024-03-25 DIAGNOSIS — R011 Cardiac murmur, unspecified: Secondary | ICD-10-CM | POA: Diagnosis not present

## 2024-03-25 LAB — ECHOCARDIOGRAM COMPLETE
AR max vel: 2.67 cm2
AV Area VTI: 2.87 cm2
AV Area mean vel: 2.8 cm2
AV Mean grad: 2 mmHg
AV Peak grad: 4.1 mmHg
Ao pk vel: 1.02 m/s
Area-P 1/2: 7.09 cm2
MV VTI: 1.6 cm2
S' Lateral: 3 cm

## 2024-03-29 ENCOUNTER — Telehealth (HOSPITAL_COMMUNITY): Payer: Self-pay | Admitting: *Deleted

## 2024-03-29 NOTE — Telephone Encounter (Signed)
 Reaching out to patient to offer assistance regarding upcoming cardiac imaging study; patient request to reschedule due to weather.  Appt rescheduled. Sid Seats RN Navigator Cardiac Imaging [facility] [phone] office [phone] cell

## 2024-03-30 ENCOUNTER — Ambulatory Visit (HOSPITAL_BASED_OUTPATIENT_CLINIC_OR_DEPARTMENT_OTHER)

## 2024-04-02 ENCOUNTER — Encounter (HOSPITAL_COMMUNITY): Payer: Self-pay

## 2024-04-05 ENCOUNTER — Telehealth (HOSPITAL_COMMUNITY): Payer: Self-pay | Admitting: Emergency Medicine

## 2024-04-05 NOTE — Telephone Encounter (Signed)
 Pt wishing to r/s for MCA due to distance Camie Shutter RN Navigator Cardiac Imaging [facility] [phone] Office  [phone] Cell

## 2024-04-06 ENCOUNTER — Telehealth: Payer: Self-pay | Admitting: Cardiology

## 2024-04-06 ENCOUNTER — Ambulatory Visit (HOSPITAL_BASED_OUTPATIENT_CLINIC_OR_DEPARTMENT_OTHER)

## 2024-04-06 ENCOUNTER — Other Ambulatory Visit: Payer: Self-pay

## 2024-04-06 ENCOUNTER — Ambulatory Visit: Admitting: Cardiology

## 2024-04-06 MED ORDER — APIXABAN 5 MG PO TABS: 5.0000 mg | ORAL_TABLET | Freq: Two times a day (BID) | ORAL | 1 refills | Status: AC

## 2024-04-06 MED ORDER — METOPROLOL TARTRATE 100 MG PO TABS: ORAL_TABLET | ORAL | 0 refills | Status: DC

## 2024-04-07 ENCOUNTER — Other Ambulatory Visit: Payer: Self-pay | Admitting: Cardiology

## 2024-04-09 ENCOUNTER — Telehealth: Payer: Self-pay | Admitting: Cardiology

## 2024-04-09 NOTE — Telephone Encounter (Signed)
 Left message for the patient to call back.

## 2024-04-09 NOTE — Telephone Encounter (Signed)
 Pt calling to ask why she is seeing pharmd on 2/11. Made her aware it was for lipid treatment, pt has clinical questions. Please advise.

## 2024-04-14 ENCOUNTER — Ambulatory Visit: Admitting: Pharmacist Clinician (PhC)/ Clinical Pharmacy Specialist

## 2024-04-29 ENCOUNTER — Other Ambulatory Visit (HOSPITAL_BASED_OUTPATIENT_CLINIC_OR_DEPARTMENT_OTHER): Admitting: Radiology

## 2024-05-12 ENCOUNTER — Ambulatory Visit: Admitting: Cardiology

## 2024-08-23 ENCOUNTER — Ambulatory Visit: Admitting: Oncology
